# Patient Record
Sex: Female | Born: 1950
Health system: Southern US, Community
[De-identification: ages and names within clinical notes are randomized; demographics above are authoritative.]

## PROBLEM LIST (undated history)

## (undated) DIAGNOSIS — I10 Essential (primary) hypertension: Secondary | ICD-10-CM

## (undated) DIAGNOSIS — K439 Ventral hernia without obstruction or gangrene: Secondary | ICD-10-CM

## (undated) DIAGNOSIS — E785 Hyperlipidemia, unspecified: Secondary | ICD-10-CM

## (undated) DIAGNOSIS — K760 Fatty (change of) liver, not elsewhere classified: Secondary | ICD-10-CM

## (undated) DIAGNOSIS — M810 Age-related osteoporosis without current pathological fracture: Secondary | ICD-10-CM

## (undated) DIAGNOSIS — E119 Type 2 diabetes mellitus without complications: Secondary | ICD-10-CM

## (undated) HISTORY — DX: Essential (primary) hypertension: I10

## (undated) HISTORY — DX: Type 2 diabetes mellitus without complications: E11.9

## (undated) HISTORY — DX: Fatty (change of) liver, not elsewhere classified: K76.0

## (undated) HISTORY — PX: COLONOSCOPY: SHX174

## (undated) HISTORY — DX: Age-related osteoporosis without current pathological fracture: M81.0

## (undated) HISTORY — DX: Ventral hernia without obstruction or gangrene: K43.9

## (undated) HISTORY — DX: Hyperlipidemia, unspecified: E78.5

---

## 2010-04-27 ENCOUNTER — Ambulatory Visit: Payer: Self-pay | Admitting: Diagnostic Radiology

## 2010-04-27 ENCOUNTER — Emergency Department (HOSPITAL_BASED_OUTPATIENT_CLINIC_OR_DEPARTMENT_OTHER): Admission: EM | Admit: 2010-04-27 | Discharge: 2010-04-27 | Payer: Self-pay | Admitting: Emergency Medicine

## 2014-01-09 DIAGNOSIS — J069 Acute upper respiratory infection, unspecified: Secondary | ICD-10-CM | POA: Diagnosis not present

## 2014-01-29 ENCOUNTER — Ambulatory Visit (INDEPENDENT_AMBULATORY_CARE_PROVIDER_SITE_OTHER): Payer: Medicare Other | Admitting: Family

## 2014-01-29 ENCOUNTER — Encounter: Payer: Self-pay | Admitting: Family

## 2014-01-29 VITALS — BP 114/78 | HR 67 | Temp 97.8°F | Resp 16 | Ht 59.5 in | Wt 117.0 lb

## 2014-01-29 DIAGNOSIS — H9190 Unspecified hearing loss, unspecified ear: Secondary | ICD-10-CM

## 2014-01-29 DIAGNOSIS — F3289 Other specified depressive episodes: Secondary | ICD-10-CM | POA: Diagnosis not present

## 2014-01-29 DIAGNOSIS — H9191 Unspecified hearing loss, right ear: Secondary | ICD-10-CM

## 2014-01-29 DIAGNOSIS — K219 Gastro-esophageal reflux disease without esophagitis: Secondary | ICD-10-CM

## 2014-01-29 DIAGNOSIS — M79609 Pain in unspecified limb: Secondary | ICD-10-CM

## 2014-01-29 DIAGNOSIS — M79644 Pain in right finger(s): Secondary | ICD-10-CM

## 2014-01-29 DIAGNOSIS — F329 Major depressive disorder, single episode, unspecified: Secondary | ICD-10-CM

## 2014-01-29 MED ORDER — OMEPRAZOLE 40 MG PO CPDR: 40.0000 mg | DELAYED_RELEASE_CAPSULE | Freq: Every day | ORAL | Status: DC

## 2014-01-29 MED ORDER — ESCITALOPRAM OXALATE 10 MG PO TABS: ORAL_TABLET | ORAL | Status: DC

## 2014-01-29 NOTE — Patient Instructions (Addendum)
Increase omeprazole to 40mg  once daily.  A prescription has been sent to your pharmacy. Start lexapro 10mg .  1/2 tab by mouth once daily for 1 week, then increase to a full tab once daily on week two. Call if cough worsens or if it does not improve in next 2 weeks. Follow up in 1 month for a medicare wellness visit.   Welcome to [facility]!

## 2014-01-29 NOTE — Progress Notes (Signed)
Pre visit review using our clinic review tool, if applicable. No additional management support is needed unless otherwise documented below in the visit note. 

## 2014-01-29 NOTE — Progress Notes (Signed)
Subjective:    Patient ID: Hailey Rasmussen, female    DOB: Aug 27, 1950, 63 y.o.   MRN: [id number]  HPI  Son reports that she has followed by [facility]. She presents today with her son who assists with vietnamese translation.      Pt has had a cough for 4 weeks. Was really bad initially. She was evaluated at urgent care ([facility]). She was given antibiotic and cough medication and symptoms improved.  Now a dry cough at night.   She denies nasal congestion or fever.    Establish Care Pt new to establish care. Wants a check up. Pt has never had colonoscopy, Unsure if she has had a DEXA, last mammogram about 2 yrs ago. Last tetanus 07/2013.  Right thumb- she has some tingling/soreness. Denies injury.  She is retired.  This has been present x 2-3 months.      Cough Pt reports cough x 4 weeks. Completed antibiotic through urgent care but cough persists. tingling Pt reports tingling and sensations like electrical shocks in her right tunmb x 2-3 months.  GERD- reports well controlled with omeprazole.   Depression-  Had hospitalization back in [place] when she was separated from her family.  Son notes that she is "not a happy person."  Not tearful.  Son notes + irritability, + anxiety.    Hearing loss- told perforated TM. Has some associated hearing loss.   Review of Systems  Constitutional: Negative for unexpected weight change.  HENT: Negative for postnasal drip.   Respiratory: Positive for cough.   Cardiovascular: Negative for chest pain.  Gastrointestinal: Negative for abdominal pain.  Genitourinary: Negative for dysuria and frequency.  Musculoskeletal:       Takes prn meloxicam for bilateral knee pain  Neurological: Negative for headaches.  Hematological: Negative for adenopathy.   Past Medical History  Diagnosis Date  . Hyperlipidemia     History   Social History  . Marital Status: Married    Spouse Name: N/A    Number of Children: N/A  . Years of  Education: N/A   Occupational History  . Not on file.   Social History Main Topics  . Smoking status: Never Smoker   . Smokeless tobacco: Never Used  . Alcohol Use: No  . Drug Use: Not on file  . Sexual Activity: Not on file   Other Topics Concern  . Not on file   Social History Narrative   Lives with her husband and son   She has 7 children (one daughter died at age 92) 1 children all live in [place]   Retired homemaker   She completed high school   Moved to [place] from [place] in 1997          History reviewed. No pertinent past surgical history.  Family History  Problem Relation Age of Onset  . Heart disease Father     No Known Allergies  No current outpatient prescriptions on file prior to visit.   No current facility-administered medications on file prior to visit.    BP 114/78  Pulse 67  Temp(Src) 97.8 F (36.6 C) (Oral)  Resp 16  Ht 4' 11.5" (1.511 m)  Wt 117 lb (53.071 kg)  BMI 23.24 kg/m2  SpO2 97%  LMP 07/27/1995       Objective:   Physical Exam  Constitutional: She is oriented to person, place, and time. She appears well-developed and well-nourished. No distress.  HENT:  Head: Normocephalic and atraumatic.  Right Ear: Tympanic membrane and ear canal normal.  Left Ear: Tympanic membrane and ear canal normal.  Mouth/Throat: No oropharyngeal exudate, posterior oropharyngeal edema or posterior oropharyngeal erythema.  Cardiovascular: Normal rate and regular rhythm.   No murmur heard. Pulmonary/Chest: Effort normal and breath sounds normal. No respiratory distress. She has no wheezes. She has no rales. She exhibits no tenderness.  Neurological: She is alert and oriented to person, place, and time.  Psychiatric: She has a normal mood and affect. Her behavior is normal. Judgment and thought content normal.          Assessment & Plan:

## 2014-01-31 DIAGNOSIS — H9190 Unspecified hearing loss, unspecified ear: Secondary | ICD-10-CM | POA: Insufficient documentation

## 2014-01-31 DIAGNOSIS — M79644 Pain in right finger(s): Secondary | ICD-10-CM | POA: Insufficient documentation

## 2014-01-31 DIAGNOSIS — K219 Gastro-esophageal reflux disease without esophagitis: Secondary | ICD-10-CM | POA: Insufficient documentation

## 2014-01-31 DIAGNOSIS — F329 Major depressive disorder, single episode, unspecified: Secondary | ICD-10-CM | POA: Insufficient documentation

## 2014-01-31 DIAGNOSIS — F32A Depression, unspecified: Secondary | ICD-10-CM | POA: Insufficient documentation

## 2014-01-31 NOTE — Assessment & Plan Note (Signed)
Trial of meloxicam.  

## 2014-01-31 NOTE — Assessment & Plan Note (Signed)
Will initiate lexapro 10 mg.  I instructed pt to start 1/2 tablet once daily for 1 week and then increase to a full tablet once daily on week two as tolerated.  We discussed common side effects such as nausea, drowsiness and weight gain.  Also discussed rare but serious side effect of suicide ideation.  She is instructed to discontinue medication go directly to ED if this occurs.  Pt verbalizes understanding.  Plan follow up in 1 month to evaluate progress.

## 2014-01-31 NOTE — Assessment & Plan Note (Signed)
Could be contributing to cough. Will increase prilosec to 40mg .

## 2014-01-31 NOTE — Assessment & Plan Note (Signed)
Declines referral to ENT.  No TM perforation is noted.

## 2014-03-04 ENCOUNTER — Inpatient Hospital Stay (HOSPITAL_BASED_OUTPATIENT_CLINIC_OR_DEPARTMENT_OTHER)
Admission: RE | Admit: 2014-03-04 | Discharge: 2014-03-04 | Disposition: A | Discharge status: Home / Self Care | Payer: Medicare Other | Source: Ambulatory Visit | Attending: Family | Admitting: Family

## 2014-03-04 ENCOUNTER — Ambulatory Visit (HOSPITAL_BASED_OUTPATIENT_CLINIC_OR_DEPARTMENT_OTHER)
Admission: RE | Admit: 2014-03-04 | Discharge: 2014-03-04 | Disposition: A | Discharge status: Home / Self Care | Payer: Medicare Other | Source: Ambulatory Visit | Attending: Family | Admitting: Family

## 2014-03-04 ENCOUNTER — Other Ambulatory Visit: Payer: Self-pay | Admitting: Family

## 2014-03-04 ENCOUNTER — Ambulatory Visit (INDEPENDENT_AMBULATORY_CARE_PROVIDER_SITE_OTHER): Payer: Medicare Other | Admitting: Family

## 2014-03-04 ENCOUNTER — Encounter: Payer: Self-pay | Admitting: Family

## 2014-03-04 VITALS — BP 140/80 | HR 65 | Temp 98.1°F | Resp 16 | Ht 59.5 in | Wt 116.0 lb

## 2014-03-04 DIAGNOSIS — F329 Major depressive disorder, single episode, unspecified: Secondary | ICD-10-CM

## 2014-03-04 DIAGNOSIS — K219 Gastro-esophageal reflux disease without esophagitis: Secondary | ICD-10-CM

## 2014-03-04 DIAGNOSIS — M79609 Pain in unspecified limb: Secondary | ICD-10-CM

## 2014-03-04 DIAGNOSIS — Z0000 Encounter for general adult medical examination without abnormal findings: Secondary | ICD-10-CM

## 2014-03-04 DIAGNOSIS — F3289 Other specified depressive episodes: Secondary | ICD-10-CM

## 2014-03-04 DIAGNOSIS — Z1231 Encounter for screening mammogram for malignant neoplasm of breast: Secondary | ICD-10-CM | POA: Diagnosis not present

## 2014-03-04 DIAGNOSIS — Z1211 Encounter for screening for malignant neoplasm of colon: Secondary | ICD-10-CM

## 2014-03-04 DIAGNOSIS — E785 Hyperlipidemia, unspecified: Secondary | ICD-10-CM

## 2014-03-04 DIAGNOSIS — Z1239 Encounter for other screening for malignant neoplasm of breast: Secondary | ICD-10-CM

## 2014-03-04 DIAGNOSIS — Z23 Encounter for immunization: Secondary | ICD-10-CM

## 2014-03-04 DIAGNOSIS — Z13820 Encounter for screening for osteoporosis: Secondary | ICD-10-CM

## 2014-03-04 DIAGNOSIS — M79644 Pain in right finger(s): Secondary | ICD-10-CM

## 2014-03-04 LAB — LIPID PANEL
Cholesterol: 216 mg/dL — ABNORMAL HIGH (ref 0–200)
HDL: 54 mg/dL (ref >39)
LDL Cholesterol: 128 mg/dL — ABNORMAL HIGH (ref 0–99)
Total CHOL/HDL Ratio: 4 Ratio
Triglycerides: 169 mg/dL — ABNORMAL HIGH (ref <150)
VLDL: 34 mg/dL (ref 0–40)

## 2014-03-04 LAB — HEPATIC FUNCTION PANEL
ALT: 24 U/L (ref 0–35)
AST: 25 U/L (ref 0–37)
Albumin: 4.8 g/dL (ref 3.5–5.2)
Alkaline Phosphatase: 62 U/L (ref 39–117)
Bilirubin, Direct: 0.1 mg/dL (ref 0.0–0.3)
Indirect Bilirubin: 0.7 mg/dL (ref 0.2–1.2)
Total Bilirubin: 0.8 mg/dL (ref 0.2–1.2)
Total Protein: 7.7 g/dL (ref 6.0–8.3)

## 2014-03-04 LAB — BASIC METABOLIC PANEL
BUN: 17 mg/dL (ref 6–23)
CO2: 26 mEq/L (ref 19–32)
Calcium: 10.3 mg/dL (ref 8.4–10.5)
Chloride: 104 mEq/L (ref 96–112)
Creat: 0.59 mg/dL (ref 0.50–1.10)
Glucose, Bld: 102 mg/dL — ABNORMAL HIGH (ref 70–99)
Potassium: 4.6 mEq/L (ref 3.5–5.3)
Sodium: 139 mEq/L (ref 135–145)

## 2014-03-04 MED ORDER — ESCITALOPRAM OXALATE 10 MG PO TABS: ORAL_TABLET | ORAL | Status: DC

## 2014-03-04 MED ORDER — MELOXICAM 7.5 MG PO TABS: 7.5000 mg | ORAL_TABLET | Freq: Every day | ORAL | Status: DC | PRN

## 2014-03-04 NOTE — Patient Instructions (Addendum)
Please complete lab work prior to leaving.  Please check with your insurance re: coverage for shingles vaccine if covered we will plan to give you next visit.   Please schedule your mammogram on the first floor in the imaging department. You will be contacted re: scheduling of your colonoscopy and your bone density. Start lexapro. Follow up in 1 month.

## 2014-03-04 NOTE — Progress Notes (Addendum)
Subjective:    Patient ID: Hailey Rasmussen, female    DOB: August 10, 1950, 63 y.o.   MRN: [id number]  HPI  Subjective:   Patient here for Welcome to Medicare visit and management of other chronic and acute problems.  Pt presents today with vietnamese interpretor and her son.  Depression- last visit she was started on lexapro.   She reports that she has not started lexapro.  Denies sadness or irritability.  She reports that she generally sleeps ok.    Cough- last visit she noted cough. Reports that her cough has improved, however she has not increased her prilosec as recommended..    Reports right tinnitis.  This is not new  R thumb pain- Improved. last visit meloxicam was started prn. Reports that she has occasional numbness but not pain.   Immunizations: due for pneumovax and zostavax.  Diet:  Reports healthy diet Exercise:  little Colonoscopy: never,  Dexa: due Pap Smear: declines Mammogram: due    Risk factors: none  Roster of Physicians Providing Medical Care to Patient: Activities of Daily Living  In your present state of health, do you have any difficulty performing the following activities? Preparing food and eating?: No  Bathing yourself: No  Getting dressed: No  Using the toilet:No  Moving around from place to place: No  In the past year have you fallen or had a near fall?:No    Home Safety: Has smoke detector and wears seat belts. No firearms. No excess sun exposure.  Diet and Exercise  Current exercise habits:  Dietary issues discussed: healthy diet   Depression Screen  (Note: if answer to either of the following is "Yes", then a more complete depression screening is indicated)  Q1: Over the past two weeks, have you felt down, depressed or hopeless?no  Q2: Over the past two weeks, have you felt little interest or pleasure in doing things? no   The following portions of the patient's history were reviewed and updated as appropriate: allergies, current  medications, past family history, past medical history, past social history, past surgical history and problem list.    Review of Systems  See hpi   Objective:   Vision: see nursing Hearing: able to hear forced whisper at 6 feet Body mass index: Body mass index is 23.05 kg/(m^2). Cognitive Impairment Assessment: cognition, memory and judgment appear normal.   Physical Exam  Constitutional: She is oriented to person, place, and time. She appears well-developed and well-nourished. No distress.  HENT:  Head: Normocephalic and atraumatic.  Right Ear: Tympanic membrane and ear canal normal.  Left Ear: Tympanic membrane and ear canal normal.  Mouth/Throat: Oropharynx is clear and moist.  Eyes: Pupils are equal, round, and reactive to light. No scleral icterus.  Neck: Normal range of motion. No thyromegaly present.  Cardiovascular: Normal rate and regular rhythm.   No murmur heard. Pulmonary/Chest: Effort normal and breath sounds normal. No respiratory distress. He has no wheezes. She has no rales. She exhibits no tenderness.  Abdominal: Soft. Bowel sounds are normal. He exhibits no distension and no mass. There is no tenderness. There is no rebound and no guarding.  Musculoskeletal: She exhibits no edema.  Lymphadenopathy:    She has no cervical adenopathy.  Neurological: She is alert and oriented to person, place, and time. She has normal reflexes. She exhibits normal muscle tone. Coordination normal.  Skin: Skin is warm and dry.  Psychiatric: She has a normal mood and affect. Her behavior is normal. Judgment and  thought content normal.  Breasts: Examined lying Right: Without masses, retractions, discharge or axillary adenopathy.  Left: Without masses, retractions, discharge or axillary adenopathy.  Pelvic: declines           Assessment & Plan:     Assessment:   Medicare wellness utd on preventive parameters    Plan:    During the course of the visit the patient was  educated and counseled about appropriate screening and preventive services including:      Screening mammography  Bone densitometry screening  Diabetes screening  Vaccines / LABS Pnemonccoal Vaccine  Today- will need zostavax next visit.   Patient Instructions (the written plan) was given to the patient.      Review of Systems     Objective:   Physical Exam        Assessment & Plan:

## 2014-03-04 NOTE — Progress Notes (Signed)
Pre visit review using our clinic review tool, if applicable. No additional management support is needed unless otherwise documented below in the visit note. 

## 2014-03-05 ENCOUNTER — Encounter: Payer: Self-pay | Admitting: Family

## 2014-03-05 DIAGNOSIS — E785 Hyperlipidemia, unspecified: Secondary | ICD-10-CM | POA: Insufficient documentation

## 2014-03-12 ENCOUNTER — Other Ambulatory Visit: Payer: Medicare Other

## 2014-04-05 ENCOUNTER — Ambulatory Visit (INDEPENDENT_AMBULATORY_CARE_PROVIDER_SITE_OTHER): Payer: Medicare Other | Admitting: Family

## 2014-04-05 ENCOUNTER — Encounter: Payer: Self-pay | Admitting: Family

## 2014-04-05 VITALS — BP 120/78 | HR 58 | Temp 98.3°F | Resp 16 | Ht 59.5 in | Wt 115.6 lb

## 2014-04-05 DIAGNOSIS — M545 Low back pain, unspecified: Secondary | ICD-10-CM | POA: Diagnosis not present

## 2014-04-05 DIAGNOSIS — F3289 Other specified depressive episodes: Secondary | ICD-10-CM

## 2014-04-05 DIAGNOSIS — F329 Major depressive disorder, single episode, unspecified: Secondary | ICD-10-CM

## 2014-04-05 DIAGNOSIS — Z23 Encounter for immunization: Secondary | ICD-10-CM

## 2014-04-05 DIAGNOSIS — Z2911 Encounter for prophylactic immunotherapy for respiratory syncytial virus (RSV): Secondary | ICD-10-CM

## 2014-04-05 DIAGNOSIS — R7309 Other abnormal glucose: Secondary | ICD-10-CM

## 2014-04-05 DIAGNOSIS — R739 Hyperglycemia, unspecified: Secondary | ICD-10-CM

## 2014-04-05 LAB — HEMOGLOBIN A1C: Hgb A1c MFr Bld: 6.1 % (ref 4.6–6.5)

## 2014-04-05 MED ORDER — ESCITALOPRAM OXALATE 10 MG PO TABS: ORAL_TABLET | ORAL | Status: DC

## 2014-04-05 MED ORDER — MELOXICAM 7.5 MG PO TABS: 7.5000 mg | ORAL_TABLET | Freq: Every day | ORAL | Status: DC | PRN

## 2014-04-05 NOTE — Patient Instructions (Addendum)
Please complete lab work prior to leaving.  Please contact GI to schedule appointment: [phone] You should be contacted about your bone density appointment- let me know if you have not heard back in 1 week. Follow up in 3 months.

## 2014-04-05 NOTE — Assessment & Plan Note (Signed)
Mild improvement. Continue lexapro. Follow up in 3 months.

## 2014-04-05 NOTE — Assessment & Plan Note (Signed)
Advised pt that she can use meloxicam as needed for flare ups and to let me know if symptoms worsen or do not improve.

## 2014-04-05 NOTE — Progress Notes (Signed)
Pre visit review using our clinic review tool, if applicable. No additional management support is needed unless otherwise documented below in the visit note. 

## 2014-04-05 NOTE — Progress Notes (Signed)
   Subjective:    Patient ID: Hailey Rasmussen, female    DOB: Dec 31, 1950, 63 y.o.   MRN: [id number]  HPI  Hailey Rasmussen is a 63 yr old female who presents today for follow up:  1) Depression- last visit she was started on lexapro.  Her son assists today with vietnamese translation. The pt has been on lexapro x 1 month. She is reportedly tolerating without difficulty. Son notes slight improvement in her ability to enjoy activities.  Wants to give eht medicine "more time."   2) Mild low back pain- pt reports occasional mild low back pain.   Review of Systems    see HPI  Past Medical History  Diagnosis Date  . Hyperlipidemia     History   Social History  . Marital Status: Married    Spouse Name: N/A    Number of Children: N/A  . Years of Education: N/A   Occupational History  . Not on file.   Social History Main Topics  . Smoking status: Never Smoker   . Smokeless tobacco: Never Used  . Alcohol Use: No  . Drug Use: Not on file  . Sexual Activity: Not on file   Other Topics Concern  . Not on file   Social History Narrative   Lives with her husband and son   She has 7 children (one daughter died at age 81) 62 children all live in [place]   Retired homemaker   She completed high school   Moved to [place] from [place] in 1997          No past surgical history on file.  Family History  Problem Relation Age of Onset  . Heart disease Father     No Known Allergies  Current Outpatient Prescriptions on File Prior to Visit  Medication Sig Dispense Refill  . omeprazole (PRILOSEC) 40 MG capsule Take 1 capsule (40 mg total) by mouth daily.  30 capsule  3   No current facility-administered medications on file prior to visit.    BP 120/78  Pulse 58  Temp(Src) 98.3 F (36.8 C) (Oral)  Resp 16  Ht 4' 11.5" (1.511 m)  Wt 115 lb 9.6 oz (52.436 kg)  BMI 22.97 kg/m2  SpO2 98%  LMP 07/27/1995    Objective:   Physical Exam  Constitutional: She is oriented to person, place, and  time. She appears well-developed and well-nourished. No distress.  HENT:  Head: Normocephalic and atraumatic.  Cardiovascular: Normal rate and regular rhythm.   No murmur heard. Pulmonary/Chest: Effort normal and breath sounds normal. No respiratory distress. She has no wheezes. She has no rales. She exhibits no tenderness.  Musculoskeletal:       Thoracic back: She exhibits no bony tenderness.       Lumbar back: She exhibits no bony tenderness.  Neurological: She is alert and oriented to person, place, and time.  Psychiatric: She has a normal mood and affect. Her behavior is normal. Judgment and thought content normal.          Assessment & Plan:  The wrong phone # was listed in pt's medical record so son did not receive referral message.  I have asked him to contact GI and our scheduler to call him with bone density apt.

## 2014-04-07 ENCOUNTER — Encounter: Payer: Self-pay | Admitting: Family

## 2014-04-07 DIAGNOSIS — R739 Hyperglycemia, unspecified: Secondary | ICD-10-CM | POA: Insufficient documentation

## 2014-04-07 NOTE — Assessment & Plan Note (Signed)
Cough has improved, continue PPI.

## 2014-04-07 NOTE — Assessment & Plan Note (Signed)
Improved.  Continue sparing use of prn meloxicam.

## 2014-04-07 NOTE — Assessment & Plan Note (Signed)
Pt to start lexapro.

## 2014-05-07 ENCOUNTER — Encounter: Payer: Self-pay | Admitting: Family

## 2014-06-03 ENCOUNTER — Encounter: Payer: Self-pay | Admitting: Family

## 2014-06-03 ENCOUNTER — Ambulatory Visit (INDEPENDENT_AMBULATORY_CARE_PROVIDER_SITE_OTHER): Payer: Medicare Other | Admitting: Family

## 2014-06-03 VITALS — BP 128/80 | HR 58 | Temp 98.0°F | Resp 18 | Ht 59.5 in | Wt 113.2 lb

## 2014-06-03 DIAGNOSIS — H6692 Otitis media, unspecified, left ear: Secondary | ICD-10-CM | POA: Diagnosis not present

## 2014-06-03 DIAGNOSIS — H7292 Unspecified perforation of tympanic membrane, left ear: Secondary | ICD-10-CM | POA: Diagnosis not present

## 2014-06-03 DIAGNOSIS — K219 Gastro-esophageal reflux disease without esophagitis: Secondary | ICD-10-CM

## 2014-06-03 MED ORDER — OMEPRAZOLE 40 MG PO CPDR: 40.0000 mg | DELAYED_RELEASE_CAPSULE | Freq: Every day | ORAL | Status: DC

## 2014-06-03 MED ORDER — AMOXICILLIN 500 MG PO CAPS: 500.0000 mg | ORAL_CAPSULE | Freq: Two times a day (BID) | ORAL | Status: DC

## 2014-06-03 MED ORDER — MELOXICAM 7.5 MG PO TABS: 7.5000 mg | ORAL_TABLET | Freq: Every day | ORAL | Status: DC | PRN

## 2014-06-03 MED ORDER — ESCITALOPRAM OXALATE 10 MG PO TABS: ORAL_TABLET | ORAL | Status: DC

## 2014-06-03 NOTE — Progress Notes (Signed)
   Subjective:    Patient ID: Hailey Rasmussen, female    DOB: Nov 30, 1950, 63 y.o.   MRN: [id number]  HPI  Hailey Rasmussen is a 63 yr old female who presents today with vietnamese interpreter with chief complaint of difficulty hearing out of the left ear.  Notes associated fluid drainage from the left ear for 2-3 weeks.  She denies pain but notes that the ear is "itching. Reports that 2 years ago she had a procedure performed on the ear "near [facility].     She is not taking omeprazole. Reports + coughing around  1-2 AM.    Review of Systems    see HPI  Past Medical History  Diagnosis Date  . Hyperlipidemia     History   Social History  . Marital Status: Married    Spouse Name: N/A    Number of Children: N/A  . Years of Education: N/A   Occupational History  . Not on file.   Social History Main Topics  . Smoking status: Never Smoker   . Smokeless tobacco: Never Used  . Alcohol Use: No  . Drug Use: Not on file  . Sexual Activity: Not on file   Other Topics Concern  . Not on file   Social History Narrative   Lives with her husband and son   She has 7 children (one daughter died at age 38) 84 children all live in [place]   Retired homemaker   She completed high school   Moved to [place] from [place] in 1997          No past surgical history on file.  Family History  Problem Relation Age of Onset  . Heart disease Father     No Known Allergies  Current Outpatient Prescriptions on File Prior to Visit  Medication Sig Dispense Refill  . escitalopram (LEXAPRO) 10 MG tablet One tablet by mouth once daily 30 tablet 3  . meloxicam (MOBIC) 7.5 MG tablet Take 1 tablet (7.5 mg total) by mouth daily as needed for pain. 30 tablet 3  . omeprazole (PRILOSEC) 40 MG capsule Take 1 capsule (40 mg total) by mouth daily. 30 capsule 3   No current facility-administered medications on file prior to visit.    BP 128/80 mmHg  Pulse 58  Temp(Src) 98 F (36.7 C) (Oral)  Resp 18  Ht  4' 11.5" (1.511 m)  Wt 113 lb 3.2 oz (51.347 kg)  BMI 22.49 kg/m2  SpO2 99%  LMP 07/27/1995    Objective:   Physical Exam  Constitutional: She is oriented to person, place, and time. She appears well-developed and well-nourished. No distress.  HENT:  Head: Normocephalic and atraumatic.  Right Ear: Tympanic membrane and ear canal normal.  L TM is dull, purulent drainage noted behind TM.  Small performation in TM.    Cardiovascular: Normal rate and regular rhythm.   No murmur heard. Pulmonary/Chest: Effort normal and breath sounds normal. No respiratory distress. She has no wheezes. She has no rales. She exhibits no tenderness.  Neurological: She is alert and oriented to person, place, and time.  Psychiatric: She has a normal mood and affect. Her behavior is normal. Judgment and thought content normal.          Assessment & Plan:

## 2014-06-03 NOTE — Patient Instructions (Addendum)
Start amoxicillin for your ear infection. You will be contacted about your referral to the Ear nose and throat doctor in [place]. Please contact [facility] to schedule your colonoscopy appointment: [address], [place], [place] [address] [phone] Start prilosec (omeprazole) for cough.   Follow up in December as scheduled, sooner if problems/concerns.

## 2014-06-04 DIAGNOSIS — H7290 Unspecified perforation of tympanic membrane, unspecified ear: Secondary | ICD-10-CM | POA: Insufficient documentation

## 2014-06-04 NOTE — Assessment & Plan Note (Signed)
Deteriorated (cough at HS) advised pt to resume PPI.

## 2014-06-04 NOTE — Assessment & Plan Note (Signed)
Will rx with amoxicillin and refer to ENT- pt requests referral to new ENT.  Will arrange.

## 2014-06-17 ENCOUNTER — Encounter: Payer: Self-pay | Admitting: Family

## 2014-06-17 ENCOUNTER — Ambulatory Visit (INDEPENDENT_AMBULATORY_CARE_PROVIDER_SITE_OTHER): Payer: Medicare Other | Admitting: Family

## 2014-06-17 ENCOUNTER — Telehealth: Payer: Self-pay | Admitting: Family

## 2014-06-17 VITALS — BP 137/56 | HR 59 | Temp 97.9°F | Resp 18 | Ht 59.5 in | Wt 115.6 lb

## 2014-06-17 DIAGNOSIS — F329 Major depressive disorder, single episode, unspecified: Secondary | ICD-10-CM

## 2014-06-17 DIAGNOSIS — R739 Hyperglycemia, unspecified: Secondary | ICD-10-CM

## 2014-06-17 DIAGNOSIS — F32A Depression, unspecified: Secondary | ICD-10-CM

## 2014-06-17 DIAGNOSIS — H7292 Unspecified perforation of tympanic membrane, left ear: Secondary | ICD-10-CM

## 2014-06-17 DIAGNOSIS — H7290 Unspecified perforation of tympanic membrane, unspecified ear: Secondary | ICD-10-CM | POA: Diagnosis not present

## 2014-06-17 NOTE — Patient Instructions (Addendum)
You will be contacted about your referral to ENT. We will call you if we are able to find a therapist who speaks vietnamese locally.  Follow up in January before your trip.

## 2014-06-17 NOTE — Progress Notes (Signed)
Pre visit review using our clinic review tool, if applicable. No additional management support is needed unless otherwise documented below in the visit note. 

## 2014-06-17 NOTE — Progress Notes (Signed)
Subjective:    Patient ID: Hailey Rasmussen, female    DOB: July 21, 1951, 63 y.o.   MRN: [id number]  HPI  Hailey Rasmussen is a 63 yr old female who presents today for follow up. She is accompanied by her son who assists with translation.   1) Depression-Son notes that her symptoms are about the same. Still somewhat withdrawn. Does enjoy working outside in the nice weather. Otherwise she does not leave the house much and does not participate in family outings.   2) Hyperglycemia- she has switched to brown rice.  Occasional noodles.   Lab Results  Component Value Date   HGBA1C 6.1 04/05/2014   3) L Otitis media- last visit she was treated with amoxicillin and a referral was made to ENT, she was unable to get in with ENT at [facility] and is requesting a referral to another ENT.    Review of Systems See HPI  Past Medical History  Diagnosis Date  . Hyperlipidemia     History   Social History  . Marital Status: Married    Spouse Name: N/A    Number of Children: N/A  . Years of Education: N/A   Occupational History  . Not on file.   Social History Main Topics  . Smoking status: Never Smoker   . Smokeless tobacco: Never Used  . Alcohol Use: No  . Drug Use: Not on file  . Sexual Activity: Not on file   Other Topics Concern  . Not on file   Social History Narrative   Lives with her husband and son   She has 7 children (one daughter died at age 20) 52 children all live in [place]   Retired homemaker   She completed high school   Moved to [place] from [place] in 1997          No past surgical history on file.  Family History  Problem Relation Age of Onset  . Heart disease Father     No Known Allergies  Current Outpatient Prescriptions on File Prior to Visit  Medication Sig Dispense Refill  . amoxicillin (AMOXIL) 500 MG capsule Take 1 capsule (500 mg total) by mouth 2 (two) times daily. 20 capsule 0  . escitalopram (LEXAPRO) 10 MG tablet One tablet by mouth once daily 90  tablet 0  . meloxicam (MOBIC) 7.5 MG tablet Take 1 tablet (7.5 mg total) by mouth daily as needed for pain. 90 tablet 0  . omeprazole (PRILOSEC) 40 MG capsule Take 1 capsule (40 mg total) by mouth daily. 90 capsule 0   No current facility-administered medications on file prior to visit.    BP 137/56 mmHg  Pulse 59  Temp(Src) 97.9 F (36.6 C) (Oral)  Resp 18  Ht 4' 11.5" (1.511 m)  Wt 115 lb 9.6 oz (52.436 kg)  BMI 22.97 kg/m2  SpO2 99%  LMP 07/27/1995       Objective:   Physical Exam  Constitutional: She is oriented to person, place, and time. She appears well-developed and well-nourished. No distress.  HENT:  Head: Normocephalic and atraumatic.  Right Ear: Tympanic membrane and ear canal normal.  Left Ear: Tympanic membrane is perforated. Tympanic membrane is not injected.  Cardiovascular: Normal rate and regular rhythm.   No murmur heard. Pulmonary/Chest: Effort normal and breath sounds normal. No respiratory distress. She has no wheezes. She has no rales. She exhibits no tenderness.  Neurological: She is alert and oriented to person, place, and time.  Psychiatric: She has  a normal mood and affect. Her behavior is normal. Judgment and thought content normal.          Assessment & Plan:

## 2014-06-17 NOTE — Telephone Encounter (Signed)
Please contact pt's son and let him know that I have searched and I cannot find a local therapist who speaks vietnamese unfortunately.

## 2014-06-18 NOTE — Telephone Encounter (Signed)
Left message for pts son to return my call.

## 2014-06-23 NOTE — Assessment & Plan Note (Signed)
Refer to another ENT at family request.  No obvious infection at this time.

## 2014-06-23 NOTE — Assessment & Plan Note (Addendum)
I think that she might benefit from meeting with a therapist, but unfortunately she does not speak [place].  I have not been able to locate a therapist who speaks vietnamese in the area and the sone does not want to have her work with an interpreter when working with therapist because he does not think that his mother would be comfortable with this.  I agree that this would be awkward for her. Plan to continue lexapro.

## 2014-06-23 NOTE — Assessment & Plan Note (Signed)
Lab Results  Component Value Date   HGBA1C 6.1 04/05/2014   Stable, continue dietary modifications.

## 2014-06-25 NOTE — Telephone Encounter (Signed)
Spoke to pt's daughter, Dulce Sellar. She states pt's son is on vacation this week and will be out of the country later this month.  I asked her to have pt's son return my call and will need to update HIPPA contacts at next visit.

## 2014-06-26 ENCOUNTER — Telehealth: Payer: Self-pay | Admitting: Family

## 2014-06-26 NOTE — Telephone Encounter (Signed)
Caller name: Jahnay Lantier Relation to KU:VJDY Call back number:[phone] Pharmacy:  Reason for call: pt is returning your call, please call back

## 2014-06-26 NOTE — Telephone Encounter (Signed)
Spoke with pt's son at below #. He prefers [place] to wait on referral at this time. States pt will be going back home for 1 month after the 1st of the year. He request to add his sister to pt's HIPPA contacts. Advised him we need to have pt update HIPPA contact form at upcoming appt in January to include daughter. He voices understanding.

## 2014-07-03 DIAGNOSIS — H7292 Unspecified perforation of tympanic membrane, left ear: Secondary | ICD-10-CM | POA: Diagnosis not present

## 2014-07-15 ENCOUNTER — Ambulatory Visit: Payer: Medicare Other | Admitting: Family

## 2014-07-30 ENCOUNTER — Encounter: Payer: Self-pay | Admitting: Family

## 2014-07-30 ENCOUNTER — Ambulatory Visit (HOSPITAL_BASED_OUTPATIENT_CLINIC_OR_DEPARTMENT_OTHER)
Admission: RE | Admit: 2014-07-30 | Discharge: 2014-07-30 | Disposition: A | Discharge status: Home / Self Care | Payer: Medicare Other | Source: Ambulatory Visit | Attending: Family | Admitting: Family

## 2014-07-30 ENCOUNTER — Ambulatory Visit (INDEPENDENT_AMBULATORY_CARE_PROVIDER_SITE_OTHER): Payer: Medicare Other | Admitting: Family

## 2014-07-30 VITALS — BP 108/70 | HR 62 | Temp 98.2°F | Resp 14 | Ht 59.5 in | Wt 112.4 lb

## 2014-07-30 DIAGNOSIS — J8410 Pulmonary fibrosis, unspecified: Secondary | ICD-10-CM | POA: Diagnosis not present

## 2014-07-30 DIAGNOSIS — F329 Major depressive disorder, single episode, unspecified: Secondary | ICD-10-CM

## 2014-07-30 DIAGNOSIS — R059 Cough, unspecified: Secondary | ICD-10-CM | POA: Insufficient documentation

## 2014-07-30 DIAGNOSIS — R05 Cough: Secondary | ICD-10-CM | POA: Insufficient documentation

## 2014-07-30 DIAGNOSIS — H7292 Unspecified perforation of tympanic membrane, left ear: Secondary | ICD-10-CM | POA: Diagnosis not present

## 2014-07-30 DIAGNOSIS — F32A Depression, unspecified: Secondary | ICD-10-CM

## 2014-07-30 MED ORDER — OMEPRAZOLE 40 MG PO CPDR: 40.0000 mg | DELAYED_RELEASE_CAPSULE | Freq: Every day | ORAL | Status: DC

## 2014-07-30 MED ORDER — ESCITALOPRAM OXALATE 10 MG PO TABS: ORAL_TABLET | ORAL | Status: DC

## 2014-07-30 MED ORDER — HYDROCODONE-HOMATROPINE 5-1.5 MG/5ML PO SYRP: 5.0000 mL | ORAL_SOLUTION | Freq: Four times a day (QID) | ORAL | Status: DC | PRN

## 2014-07-30 MED ORDER — MELOXICAM 7.5 MG PO TABS: 7.5000 mg | ORAL_TABLET | Freq: Every day | ORAL | Status: DC | PRN

## 2014-07-30 NOTE — Assessment & Plan Note (Signed)
Stable on lexapro 10.

## 2014-07-30 NOTE — Progress Notes (Signed)
Pre visit review using our clinic review tool, if applicable. No additional management support is needed unless otherwise documented below in the visit note. 

## 2014-07-30 NOTE — Assessment & Plan Note (Signed)
I suspect GERD is contributing. She did not start PPI last visit.  Advised that she start PPI. Will obtain cxr to exlude pneumonia. They requested rx for hycodan syrup.  One bottle provided. Advised this should not be used long term. Recommended follow up with provider if symptoms do not improve and transition to otc med such as delsym as needed when hycodan complete.

## 2014-07-30 NOTE — Progress Notes (Signed)
   Subjective:    Patient ID: Hailey Rasmussen, female    DOB: 08/12/1950, 64 y.o.   MRN: [id number]  HPI  Hailey Rasmussen is a 64 yr old female who presents today for follow up. She will be returning to [place] in 2 days for a 3 month trip. She presents with her daughter who assists with translation and history.    1) Depression- on lexapro 10.  Daughter notes that she is moody. Overall mood has improved since she started lexapro per daughter.   2) Cough- has been present x 2 weeks.  Cough is worse at night. Coughs "all night long" per daughter. Trouble sleeping.  Had rx for hydrocodone/homatropine. No fever that they are aware of.  Denies nasal congestion. Cough is worse when she lays flat. Cough is described as wet.  Denies frank reflux symptoms.     3) Perforated TM- saw ENT, they are watching and will re-evaluate tomorrow.     Review of Systems See HPI  Past Medical History  Diagnosis Date  . Hyperlipidemia     History   Social History  . Marital Status: Married    Spouse Name: N/A    Number of Children: N/A  . Years of Education: N/A   Occupational History  . Not on file.   Social History Main Topics  . Smoking status: Never Smoker   . Smokeless tobacco: Never Used  . Alcohol Use: No  . Drug Use: Not on file  . Sexual Activity: Not on file   Other Topics Concern  . Not on file   Social History Narrative   Lives with her husband and son   She has 7 children (one daughter died at age 59) 7 children all live in [place]   Retired homemaker   She completed high school   Moved to [place] from [place] in 1997          History reviewed. No pertinent past surgical history.  Family History  Problem Relation Age of Onset  . Heart disease Father     No Known Allergies  No current outpatient prescriptions on file prior to visit.   No current facility-administered medications on file prior to visit.    BP 108/70 mmHg  Pulse 62  Temp(Src) 98.2 F (36.8 C) (Oral)  Resp 14   Ht 4' 11.5" (1.511 m)  Wt 112 lb 6.4 oz (50.984 kg)  BMI 22.33 kg/m2  SpO2 99%  LMP 07/27/1995       Objective:   Physical Exam  Constitutional: She is oriented to person, place, and time. She appears well-developed and well-nourished. No distress.  HENT:  Head: Normocephalic and atraumatic.  Mouth/Throat: Oropharynx is clear and moist.  Small perforation of left TM with some yellow fluid noted behind TM  Cardiovascular: Normal rate and regular rhythm.   No murmur heard. Pulmonary/Chest: Effort normal. No respiratory distress. She has no wheezes. She has no rales. She exhibits no tenderness.  Musculoskeletal: She exhibits no edema.  Lymphadenopathy:    She has no cervical adenopathy.  Neurological: She is alert and oriented to person, place, and time.  Psychiatric: She has a normal mood and affect. Her behavior is normal. Judgment and thought content normal.          Assessment & Plan:

## 2014-07-30 NOTE — Assessment & Plan Note (Signed)
She has follow up with ENT tomorrow.  Management per ENT

## 2014-07-30 NOTE — Patient Instructions (Signed)
Start omeprazole to help protect stomach and also to help hopefully with your cough. Please complete your chest x ray on the first floor. If you cough does not improve in the next week or so, please see a provider back home. Schedule pap smear after you return from your trip.

## 2014-07-31 DIAGNOSIS — H7292 Unspecified perforation of tympanic membrane, left ear: Secondary | ICD-10-CM | POA: Diagnosis not present

## 2014-07-31 DIAGNOSIS — H906 Mixed conductive and sensorineural hearing loss, bilateral: Secondary | ICD-10-CM | POA: Diagnosis not present

## 2014-08-02 ENCOUNTER — Encounter: Payer: Self-pay | Admitting: *Deleted

## 2014-08-07 ENCOUNTER — Other Ambulatory Visit: Payer: Self-pay | Admitting: Family

## 2014-10-23 ENCOUNTER — Ambulatory Visit (INDEPENDENT_AMBULATORY_CARE_PROVIDER_SITE_OTHER): Payer: Medicare Other | Admitting: Medical

## 2014-10-23 ENCOUNTER — Encounter: Payer: Self-pay | Admitting: Medical

## 2014-10-23 VITALS — BP 123/64 | HR 65 | Temp 98.4°F | Ht 59.5 in | Wt 112.6 lb

## 2014-10-23 DIAGNOSIS — F329 Major depressive disorder, single episode, unspecified: Secondary | ICD-10-CM

## 2014-10-23 DIAGNOSIS — F32A Depression, unspecified: Secondary | ICD-10-CM

## 2014-10-23 DIAGNOSIS — R059 Cough, unspecified: Secondary | ICD-10-CM

## 2014-10-23 DIAGNOSIS — R05 Cough: Secondary | ICD-10-CM | POA: Diagnosis not present

## 2014-10-23 MED ORDER — MELOXICAM 7.5 MG PO TABS: 7.5000 mg | ORAL_TABLET | Freq: Every day | ORAL | Status: DC

## 2014-10-23 MED ORDER — OMEPRAZOLE 40 MG PO CPDR: 40.0000 mg | DELAYED_RELEASE_CAPSULE | Freq: Every day | ORAL | Status: DC

## 2014-10-23 MED ORDER — LORATADINE 10 MG PO TABS: 10.0000 mg | ORAL_TABLET | Freq: Every day | ORAL | Status: DC

## 2014-10-23 MED ORDER — ESCITALOPRAM OXALATE 10 MG PO TABS: ORAL_TABLET | ORAL | Status: DC

## 2014-10-23 MED ORDER — FLUTICASONE PROPIONATE 50 MCG/ACT NA SUSP: 2.0000 | Freq: Every day | NASAL | Status: DC

## 2014-10-23 MED ORDER — ESCITALOPRAM OXALATE 10 MG PO TABS: 10.0000 mg | ORAL_TABLET | Freq: Every day | ORAL | Status: DC

## 2014-10-23 MED ORDER — MELOXICAM 7.5 MG PO TABS: 7.5000 mg | ORAL_TABLET | Freq: Every day | ORAL | Status: DC | PRN

## 2014-10-23 NOTE — Assessment & Plan Note (Signed)
Stable and refilled lexapro.

## 2014-10-23 NOTE — Assessment & Plan Note (Signed)
Cough that his been chronic. Cxr has been done. Various differential dx explained. By exam +post nasal drainage and boggy turbinates. Rx claritin and flonase. With rx cost may be minimal at [facility] $4 list. If other pharmacy charges a lot then get otc.  Use above meds x 2 wks. Then if not helping use omepazole as Melissa advised.(Daily).  Go ahead and schedule appointment in one month with Melissa.   Pulmonoligist referal offered today and declined. But this may be necessary if cough persist.

## 2014-10-23 NOTE — Progress Notes (Signed)
Pre visit review using our clinic review tool, if applicable. No additional management support is needed unless otherwise documented below in the visit note. 

## 2014-10-23 NOTE — Progress Notes (Signed)
Subjective:    Patient ID: Hailey Rasmussen, female    DOB: 1951/05/07, 64 y.o.   MRN: [id number]  HPI  Pt in with son. He is interpreting. Pt has been coughing for maybe 5 months. Prior pcp thought that her cough was gerd related. Pt only takes the omprazole when she needs med. She takes med about 2 times a week. She never took then [employer] on a day to day basis.   Pt denies any sneezing, itching eyes or runny nose.   Pt cxr done ordered in January was negative.  Pt cough is more at night. No wheezing. No sob. Pt never smoker. No hx of asthma. Or use of inhalers.   Pt went to other clinic apart from [facility]. Med given there did not help much.   Pt also needs refill of her lexapro. Pt has hx of depression. She is doing well with medication.    Review of Systems  Constitutional: Negative for fever, chills and fatigue.  HENT: Negative.   Respiratory: Positive for cough. Negative for chest tightness, shortness of breath and wheezing.        More/mostly at night.  Cardiovascular: Negative for chest pain and palpitations.  Gastrointestinal: Negative for nausea, vomiting, abdominal pain and diarrhea.       Rare reflux.  Genitourinary: Negative for frequency, hematuria, flank pain, enuresis and pelvic pain.  Musculoskeletal: Negative for arthralgias and gait problem.  Skin: Negative for pallor and rash.  Hematological: Negative for adenopathy. Does not bruise/bleed easily.   Past Medical History  Diagnosis Date  . Hyperlipidemia     History   Social History  . Marital Status: Married    Spouse Name: N/A  . Number of Children: N/A  . Years of Education: N/A   Occupational History  . Not on file.   Social History Main Topics  . Smoking status: Never Smoker   . Smokeless tobacco: Never Used  . Alcohol Use: No  . Drug Use: Not on file  . Sexual Activity: Not on file   Other Topics Concern  . Not on file   Social History Narrative   Lives with her husband and son   She  has 7 children (one daughter died at age 25) 55 children all live in [place]   Retired homemaker   She completed high school   Moved to [place] from [place] in 1997          No past surgical history on file.  Family History  Problem Relation Age of Onset  . Heart disease Father     No Known Allergies  No current outpatient prescriptions on file prior to visit.   No current facility-administered medications on file prior to visit.    BP 123/64 mmHg  Pulse 65  Temp(Src) 98.4 F (36.9 C) (Oral)  Ht 4' 11.5" (1.511 m)  Wt 112 lb 9.6 oz (51.075 kg)  BMI 22.37 kg/m2  SpO2 98%  LMP 07/27/1995      Objective:   Physical Exam   General  Mental Status - Alert. General Appearance - Well groomed. Not in acute distress.  Skin Rashes- No Rashes.  HEENT Head- Normal. Ear Auditory Canal - Left- Normal. Right - Normal.Tympanic Membrane- Left- Normal. Right- Normal. Eye Sclera/Conjunctiva- Left- Normal. Right- Normal. Nose & Sinuses Nasal Mucosa- Left-  Boggy and Congested. Right-  Boggy and  Congested.Bilateral maxillary and frontal sinus pressure. Mouth & Throat Lips: Upper Lip- Normal: no dryness, cracking, pallor, cyanosis, or vesicular  eruption. Lower Lip-Normal: no dryness, cracking, pallor, cyanosis or vesicular eruption. Buccal Mucosa- Bilateral- No Aphthous ulcers. Oropharynx- No Discharge or Erythema. +pnd Tonsils: Characteristics- Bilateral- No Erythema or Congestion. Size/Enlargement- Bilateral- No enlargement. Discharge- bilateral-None.  Neck Neck- Supple. No Masses.   Chest and Lung Exam Auscultation: Breath Sounds:-Clear even and unlabored.  Cardiovascular Auscultation:Rythm- Regular, rate and rhythm. Murmurs & Other Heart Sounds:Ausculatation of the heart reveal- No Murmurs.  Lymphatic Head & Neck General Head & Neck Lymphatics: Bilateral: Description- No Localized lymphadenopathy.   Abdomen Inspection:-Inspection Normal.  Palpation/Perucssion: Palpation  and Percussion of the abdomen reveal- Non Tender, No Rebound tenderness, No rigidity(Guarding) and No Palpable abdominal masses.  Liver:-Normal.  Spleen:- Normal.         Assessment & Plan:

## 2014-10-23 NOTE — Patient Instructions (Addendum)
Cough Cough that his been chronic. Cxr has been done. Various differential dx explained. By exam +post nasal drainage and boggy turbinates. Rx claritin and flonase. With rx cost may be minimal at [facility] $4 list. If other pharmacy charges a lot then get otc.  Use above meds x 2 wks. Then if not helping use omepazole as Melissa advised.(Daily).  Go ahead and schedule appointment in one month with Melissa.   Pulmonoligist referal offered today and declined. But this may be necessary if cough persist.   Depression Stable and refilled lexapro.     Follow up in one month with Melissa or as needed.  Not mobic and omeprazole also refilled today. I advised if possible just [place] mobic every other day. Currently using daily. I explained precaution.

## 2014-11-19 ENCOUNTER — Other Ambulatory Visit: Payer: Self-pay | Admitting: Medical

## 2014-11-20 NOTE — Telephone Encounter (Signed)
Question to staff on referral.

## 2014-11-27 DIAGNOSIS — H906 Mixed conductive and sensorineural hearing loss, bilateral: Secondary | ICD-10-CM | POA: Diagnosis not present

## 2015-04-15 ENCOUNTER — Encounter: Payer: Self-pay | Admitting: Family

## 2015-04-15 ENCOUNTER — Ambulatory Visit (INDEPENDENT_AMBULATORY_CARE_PROVIDER_SITE_OTHER): Payer: Medicare HMO | Admitting: Family

## 2015-04-15 ENCOUNTER — Ambulatory Visit (HOSPITAL_BASED_OUTPATIENT_CLINIC_OR_DEPARTMENT_OTHER)
Admission: RE | Admit: 2015-04-15 | Discharge: 2015-04-15 | Disposition: A | Discharge status: Home / Self Care | Payer: Medicare HMO | Source: Ambulatory Visit | Attending: Family | Admitting: Family

## 2015-04-15 ENCOUNTER — Telehealth: Payer: Self-pay | Admitting: Family

## 2015-04-15 VITALS — BP 126/70 | HR 67 | Temp 98.1°F | Resp 16 | Ht 59.5 in | Wt 114.2 lb

## 2015-04-15 DIAGNOSIS — J309 Allergic rhinitis, unspecified: Secondary | ICD-10-CM

## 2015-04-15 DIAGNOSIS — Z1239 Encounter for other screening for malignant neoplasm of breast: Secondary | ICD-10-CM

## 2015-04-15 DIAGNOSIS — Z1231 Encounter for screening mammogram for malignant neoplasm of breast: Secondary | ICD-10-CM | POA: Insufficient documentation

## 2015-04-15 DIAGNOSIS — K219 Gastro-esophageal reflux disease without esophagitis: Secondary | ICD-10-CM | POA: Diagnosis not present

## 2015-04-15 DIAGNOSIS — Z23 Encounter for immunization: Secondary | ICD-10-CM | POA: Diagnosis not present

## 2015-04-15 DIAGNOSIS — F329 Major depressive disorder, single episode, unspecified: Secondary | ICD-10-CM

## 2015-04-15 DIAGNOSIS — F32A Depression, unspecified: Secondary | ICD-10-CM

## 2015-04-15 MED ORDER — ESCITALOPRAM OXALATE 10 MG PO TABS: 10.0000 mg | ORAL_TABLET | Freq: Every day | ORAL | Status: DC

## 2015-04-15 MED ORDER — OMEPRAZOLE 40 MG PO CPDR: 40.0000 mg | DELAYED_RELEASE_CAPSULE | Freq: Every day | ORAL | Status: DC

## 2015-04-15 NOTE — Assessment & Plan Note (Signed)
Stable on PPI, continue same.  

## 2015-04-15 NOTE — Assessment & Plan Note (Signed)
Pt reports that she felt better on lexapro and wishes to resume. Will resume.

## 2015-04-15 NOTE — Progress Notes (Signed)
   Subjective:    Patient ID: Hailey Rasmussen, female    DOB: 10-23-1950, 64 y.o.   MRN: [id number]  HPI  Hailey Rasmussen is a 64 yr old female who presents today for follow up.  1) GERD- Reports well controlled on PPI.    2) Depression- pt ran out of lexapro. Son notes that the pt's mood has been good. Son notes no current issues with anxiety or irritability. Notes that she felt better on the lexapro and wishes to resume.   3) Allergic rhinitis-  Reports symptoms are well controlled on claritin and flonase.   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   Review of Systems    see HPI  Past Medical History  Diagnosis Date  . Hyperlipidemia     Social History   Social History  . Marital Status: Married    Spouse Name: N/A  . Number of Children: N/A  . Years of Education: N/A   Occupational History  . Not on file.   Social History Main Topics  . Smoking status: Never Smoker   . Smokeless tobacco: Never Used  . Alcohol Use: No  . Drug Use: Not on file  . Sexual Activity: Not on file   Other Topics Concern  . Not on file   Social History Narrative   Lives with her husband and son   She has 7 children (one daughter died at age 73) 57 children all live in [place]   Retired homemaker   She completed high school   Moved to [place] from [place] in 1997          No past surgical history on file.  Family History  Problem Relation Age of Onset  . Heart disease Father     No Known Allergies  Current Outpatient Prescriptions on File Prior to Visit  Medication Sig Dispense Refill  . escitalopram (LEXAPRO) 10 MG tablet Take 1 tablet (10 mg total) by mouth daily. 30 tablet 3  . fluticasone (FLONASE) 50 MCG/ACT nasal spray Place 2 sprays into both nostrils daily. 16 g 1  . loratadine (CLARITIN) 10 MG tablet TAKE 1 TABLET BY MOUTH EVERY DAY 30 tablet 0  . meloxicam (MOBIC) 7.5 MG  tablet Take 1 tablet (7.5 mg total) by mouth daily. 90 tablet 0  . omeprazole (PRILOSEC) 40 MG capsule Take 1 capsule (40 mg total) by mouth daily. 90 capsule 0   No current facility-administered medications on file prior to visit.    BP 126/70 mmHg  Pulse 67  Temp(Src) 98.1 F (36.7 C) (Oral)  Resp 16  Ht 4' 11.5" (1.511 m)  Wt 114 lb 3.2 oz (51.801 kg)  BMI 22.69 kg/m2  SpO2 98%  LMP 07/27/1995    Objective:   Physical Exam  Constitutional: She is oriented to person, place, and time. She appears well-developed and well-nourished.  HENT:  Head: Normocephalic and atraumatic.  Cardiovascular: Normal rate, regular rhythm and normal heart sounds.   No murmur heard. Pulmonary/Chest: Effort normal and breath sounds normal. No respiratory distress. She has no wheezes.  Neurological: She is alert and oriented to person, place, and time.  Psychiatric: She has a normal mood and affect. Her behavior is normal. Judgment and thought content normal.          Assessment & Plan:  Flu shot and prevnar today.

## 2015-04-15 NOTE — Telephone Encounter (Signed)
Could you please arrange a [place]et[place] interpreter for her next visit.

## 2015-04-15 NOTE — Assessment & Plan Note (Addendum)
Well controlled on claritin and flonase. Continue same.

## 2015-04-15 NOTE — Patient Instructions (Addendum)
Restart lexapro. Schedule mammogram on the first floor.

## 2015-04-15 NOTE — Progress Notes (Signed)
Pre visit review using our clinic review tool, if applicable. No additional management support is needed unless otherwise documented below in the visit note. 

## 2015-04-15 NOTE — Telephone Encounter (Signed)
Updated chart for interpreter needed.

## 2015-04-15 NOTE — Addendum Note (Signed)
Addended by: Kelle Darting A on: 04/15/2015 09:17 AM   Modules accepted: Orders

## 2015-04-21 ENCOUNTER — Telehealth: Payer: Self-pay | Admitting: *Deleted

## 2015-04-21 NOTE — Telephone Encounter (Signed)
Pt dropped off paperwork for Medical Certification for Disability Exceptions. Filled out as much as possible and forwarded to [facility]. JG//CMA

## 2015-04-29 NOTE — Telephone Encounter (Addendum)
Caller name: Nyoka Lint  Relation to pt: son  Call back number: [phone]    Reason for call:  Son checking on the status of forms, advised 5 to 7 business day turnaround

## 2015-04-29 NOTE — Telephone Encounter (Signed)
Forms currently with Melissa. Will contact pt when forms are ready. JG//CMA

## 2015-05-07 NOTE — Telephone Encounter (Signed)
Completed forms emailed to pt as requested. Copy sent for scanning. JG//CMA

## 2015-05-13 ENCOUNTER — Ambulatory Visit (INDEPENDENT_AMBULATORY_CARE_PROVIDER_SITE_OTHER): Payer: Medicare HMO

## 2015-05-13 VITALS — BP 122/74 | HR 66 | Ht 59.0 in | Wt 113.2 lb

## 2015-05-13 DIAGNOSIS — Z1159 Encounter for screening for other viral diseases: Secondary | ICD-10-CM

## 2015-05-13 DIAGNOSIS — Z0000 Encounter for general adult medical examination without abnormal findings: Secondary | ICD-10-CM | POA: Diagnosis not present

## 2015-05-13 NOTE — Patient Instructions (Addendum)
Stop by lab for Hep C screening.   Complete Bone Density Scan by next year.  Consider Cologuard as an alternative to Colonoscopy.  Follow up with Debbrah Alar as scheduled.   Continue to exercise and eat healthy.     Bone Densitometry Bone densitometry is an imaging test that uses a special X-ray to measure the amount of calcium and other minerals in your bones (bone density). This test is also known as a bone mineral density test or dual-energy X-ray absorptiometry (DXA). The test can measure bone density at your hip and your spine. It is similar to having a regular X-ray. You may have this test to:  Diagnose a condition that causes weak or thin bones (osteoporosis).  Predict your risk of a broken bone (fracture).  Determine how well osteoporosis treatment is working. LET [facility] CARE PROVIDER KNOW ABOUT:  Any allergies you have.  All medicines you are taking, including vitamins, herbs, eye drops, creams, and over-the-counter medicines.  Previous problems you or members of your family have had with the use of anesthetics.  Any blood disorders you have.  Previous surgeries you have had.  Medical conditions you have.  Possibility of pregnancy.  Any other medical test you had within the previous 14 days that used contrast material. RISKS AND COMPLICATIONS Generally, this is a safe procedure. However, problems can occur and may include the following:  This test exposes you to a very small amount of radiation.  The risks of radiation exposure may be greater to unborn children. BEFORE THE PROCEDURE  Do not take any calcium supplements for 24 hours before having the test. You can otherwise eat and drink what you usually do.  Take off all metal jewelry, eyeglasses, dental appliances, and any other metal objects. PROCEDURE  You may lie on an exam table. There will be an X-ray generator below you and an imaging device above you.  Other devices, such as boxes or  braces, may be used to position your body properly for the scan.  You will need to lie still while the machine slowly scans your body.  The images will show up on a computer monitor. AFTER THE PROCEDURE You may need more testing at a later time.   This information is not intended to replace advice given to you by your health care provider. Make sure you discuss any questions you have with your health care provider.   Document Released: 08/03/2004 Document Revised: 08/02/2014 Document Reviewed: 12/20/2013 Elsevier Interactive Patient Education 2016 [employer].  Colonoscopy A colonoscopy is an exam to look at the entire large intestine (colon). This exam can help find problems such as tumors, polyps, inflammation, and areas of bleeding. The exam takes about 1 hour.  LET [facility] CARE PROVIDER KNOW ABOUT:   Any allergies you have.  All medicines you are taking, including vitamins, herbs, eye drops, creams, and over-the-counter medicines.  Previous problems you or members of your family have had with the use of anesthetics.  Any blood disorders you have.  Previous surgeries you have had.  Medical conditions you have. RISKS AND COMPLICATIONS  Generally, this is a safe procedure. However, as with any procedure, complications can occur. Possible complications include:  Bleeding.  Tearing or rupture of the colon wall.  Reaction to medicines given during the exam.  Infection (rare). BEFORE THE PROCEDURE   Ask your health care provider about changing or stopping your regular medicines.  You may be prescribed an oral bowel prep. This involves  drinking a large amount of medicated liquid, starting the day before your procedure. The liquid will cause you to have multiple loose stools until your stool is almost clear or light green. This cleans out your colon in preparation for the procedure.  Do not eat or drink anything else once you have started the bowel prep, unless your  health care provider tells you it is safe to do so.  Arrange for someone to drive you home after the procedure. PROCEDURE   You will be given medicine to help you relax (sedative).  You will lie on your side with your knees bent.  A long, flexible tube with a light and camera on the end (colonoscope) will be inserted through the rectum and into the colon. The camera sends video back to a computer screen as it moves through the colon. The colonoscope also releases carbon dioxide gas to inflate the colon. This helps your health care provider see the area better.  During the exam, your health care provider may take a small tissue sample (biopsy) to be examined under a microscope if any abnormalities are found.  The exam is finished when the entire colon has been viewed. AFTER THE PROCEDURE   Do not drive for 24 hours after the exam.  You may have a small amount of blood in your stool.  You may pass moderate amounts of gas and have mild abdominal cramping or bloating. This is caused by the gas used to inflate your colon during the exam.  Ask when your test results will be ready and how you will get your results. Make sure you get your test results.   This information is not intended to replace advice given to you by your health care provider. Make sure you discuss any questions you have with your health care provider.   Document Released: 07/09/2000 Document Revised: 05/02/2013 Document Reviewed: 03/19/2013 Elsevier Interactive Patient Education [employer].

## 2015-05-13 NOTE — Progress Notes (Addendum)
Subjective:   Hailey Rasmussen is a 64 y.o. female who presents for an Initial Medicare Annual Wellness Visit.   Pt speaks [place].  She is here with her son who speaks [place].    Review of Systems: No ROS  Cardiac Risk Factors include: advanced age (>37men, >46 women)  Sleep patterns:   Sleeps approx. 6-8 hours per night/May take naps during the day/Wakes up twice during the night to use the bathroom.    Home Safety/Smoke Alarms:  Feels safe at home. She lives with husband and son. Smoke alarms present.  Firearm Safety: No firearms.  Seat Belt Safety/Bike Helmet:  Always wears seat belt.   Counseling:   Eye Exam- Never had one. Not interested in eye exam at this time.  Discussed the importance of eye exam.   Dental- Every 2 years.  Typically has dental work done in [place]. Female:  Mammo-04/17/15-negative   Dexa scan- DUE     CCS- Pt declined at this time. Discussed importance of completing CCS.  Pt still declines, but says she would consider Cologuard. Cologuard brochures given to patient.     Objective:    Today's Vitals   05/13/15 0904  BP: 122/74  Pulse: 66  Height: 4\' 11"  (1.499 m)  Weight: 113 lb 3.2 oz (51.347 kg)  SpO2: 96%  PainSc: 0-No pain    Current Medications (verified) Outpatient Encounter Prescriptions as of 05/13/2015  Medication Sig  . escitalopram (LEXAPRO) 10 MG tablet Take 1 tablet (10 mg total) by mouth daily.  . fluticasone (FLONASE) 50 MCG/ACT nasal spray Place 2 sprays into both nostrils daily.  Marland Kitchen omeprazole (PRILOSEC) 40 MG capsule Take 1 capsule (40 mg total) by mouth daily.  Marland Kitchen loratadine (CLARITIN) 10 MG tablet TAKE 1 TABLET BY MOUTH EVERY DAY (Patient not taking: Reported on 05/13/2015)  . meloxicam (MOBIC) 7.5 MG tablet Take 1 tablet (7.5 mg total) by mouth daily. (Patient not taking: Reported on 05/13/2015)   No facility-administered encounter medications on file as of 05/13/2015.    Allergies (verified) Review of patient's allergies  indicates no known allergies.   History: Past Medical History  Diagnosis Date  . Hyperlipidemia    History reviewed. No pertinent past surgical history. Family History  Problem Relation Age of Onset  . Heart disease Father    Social History   Occupational History  . Not on file.   Social History Main Topics  . Smoking status: Never Smoker   . Smokeless tobacco: Never Used  . Alcohol Use: No  . Drug Use: Not on file  . Sexual Activity: Not on file    Tobacco Counseling Counseling given: Not Answered   Activities of Daily Living In your present state of health, do you have any difficulty performing the following activities: 05/13/2015  Hearing? Y  Vision? N  Difficulty concentrating or making decisions? Y  Walking or climbing stairs? N  Dressing or bathing? N  Doing errands, shopping? Y  Preparing Food and eating ? N  Using the Toilet? N  In the past six months, have you accidently leaked urine? N  Do you have problems with loss of bowel control? N  Managing your Medications? N  Managing your Finances? Y  Housekeeping or managing your Housekeeping? N    Immunizations and Health Maintenance Immunization History  Administered Date(s) Administered  . Influenza,inj,Quad PF,36+ Mos 04/05/2014, 04/15/2015  . Pneumococcal Conjugate-13 04/15/2015  . Pneumococcal Polysaccharide-23 03/04/2014  . Tdap 07/26/2013  . Zoster 04/05/2014  Health Maintenance Due  Topic Date Due  . Hepatitis C Screening  Nov 26, 1950  . COLONOSCOPY  01/11/1999  . DEXA SCAN  01/10/2014    Patient Care Team: Debbrah Alar, NP as PCP - General (Internal Medicine)  Indicate any recent Medical Services you may have received from other than [facility] providers in the past year (date may be approximate).     Assessment:   This is a routine wellness examination for Hailey Rasmussen.   Depression- Stable on lexapro.  Scored 0/2 on PHQ 2 scale.    Hyperlipidemia- Last checked on 03/04/14.  Slightly  elevated.  Encouraged healthy diet and exercise.  Followed by PCP.  Hearing/Vision screen Hearing Screening Comments: Has hearing aid for left ear---sometimes wears it.  Right ear completely deaf.   Vision Screening Comments: No changes in vision.  Never had an eye exam.  Not interested in eye exam.   Dietary issues and exercise activities discussed: Current Exercise Habits:: Home exercise routine, Type of exercise: walking (gardening ), Time (Minutes): 30, Frequency (Times/Week): 3, Weekly Exercise (Minutes/Week): 90   Diet- per son--Pt eats 3 meals per day.  Mostly rice, vegetables and fish.    Goals    . Complete Bone Density Scan Within 1 year.     . Increase physical activity     Walking and gardening more.       Depression Screen PHQ 2/9 Scores 05/13/2015 03/08/2014 03/04/2014  PHQ - 2 Score 0 0 0    Fall Risk Fall Risk  05/13/2015 03/08/2014 03/04/2014 03/04/2014  Falls in the past year? No No No No    Cognitive Function: Alert. Great eye contact.  Dressed appropriately.  Pleasant mood.  Unable to complete MMSE due to language barrier.  Per son, pt has mild memory loss, and does not respond as quickly in conversation.  However, "her memory is not that bad." He also mentioned that patient has difficult with concentrating  sometimes. She's working on [employer], but struggles with learning and retaining the information required for the exam.      Screening Tests Health Maintenance  Topic Date Due  . Hepatitis C Screening  11-27-1950  . COLONOSCOPY  01/11/1999  . DEXA SCAN  01/10/2014  . INFLUENZA VACCINE  02/24/2016  . MAMMOGRAM  04/14/2017  . TETANUS/TDAP  07/27/2023  . ZOSTAVAX  Completed  . PNA vac Low Risk Adult  Completed      Plan:  Stop by lab for Hep C screening.   Complete Bone Density Scan by next year.  Consider Cologuard as an alternative to Colonoscopy.  Continue to exercise and eat healthy.  Follow up with Debbrah Alar  as scheduled.    During the course of the visit, Traniece was educated and counseled about the following appropriate screening and preventive services:   Vaccines to include Pneumoccal, Influenza, Hepatitis B, Td, Zostavax, HCV  Electrocardiogram  Cardiovascular disease screening  Colorectal cancer screening  Bone density screening  Diabetes screening  Glaucoma screening  Mammography/PAP  Nutrition counseling  Smoking cessation counseling  Patient Instructions (the written plan) were given to the patient.    Rudene Anda, RN   05/13/2015

## 2015-05-13 NOTE — Progress Notes (Signed)
Pre visit review using our clinic review tool, if applicable. No additional management support is needed unless otherwise documented below in the visit note. 

## 2015-05-13 NOTE — Telephone Encounter (Signed)
Son stated he never received email.  Hard copy given to son during mother's AWV today.

## 2015-05-14 ENCOUNTER — Encounter: Payer: Self-pay | Admitting: Family

## 2015-05-14 LAB — HEPATITIS C ANTIBODY: HCV Ab: NEGATIVE

## 2015-05-23 NOTE — Progress Notes (Signed)
Medicare wellness is reviewed.

## 2015-06-13 IMAGING — CR DG CHEST 2V
2 series · 2 of 2 positions shown · non-contrast
Comparison: 05/24/2008

CLINICAL DATA: Cough. Two week history of cough, rule out
pneumonia.

EXAM:
CHEST  2 VIEW

[w chest pa]
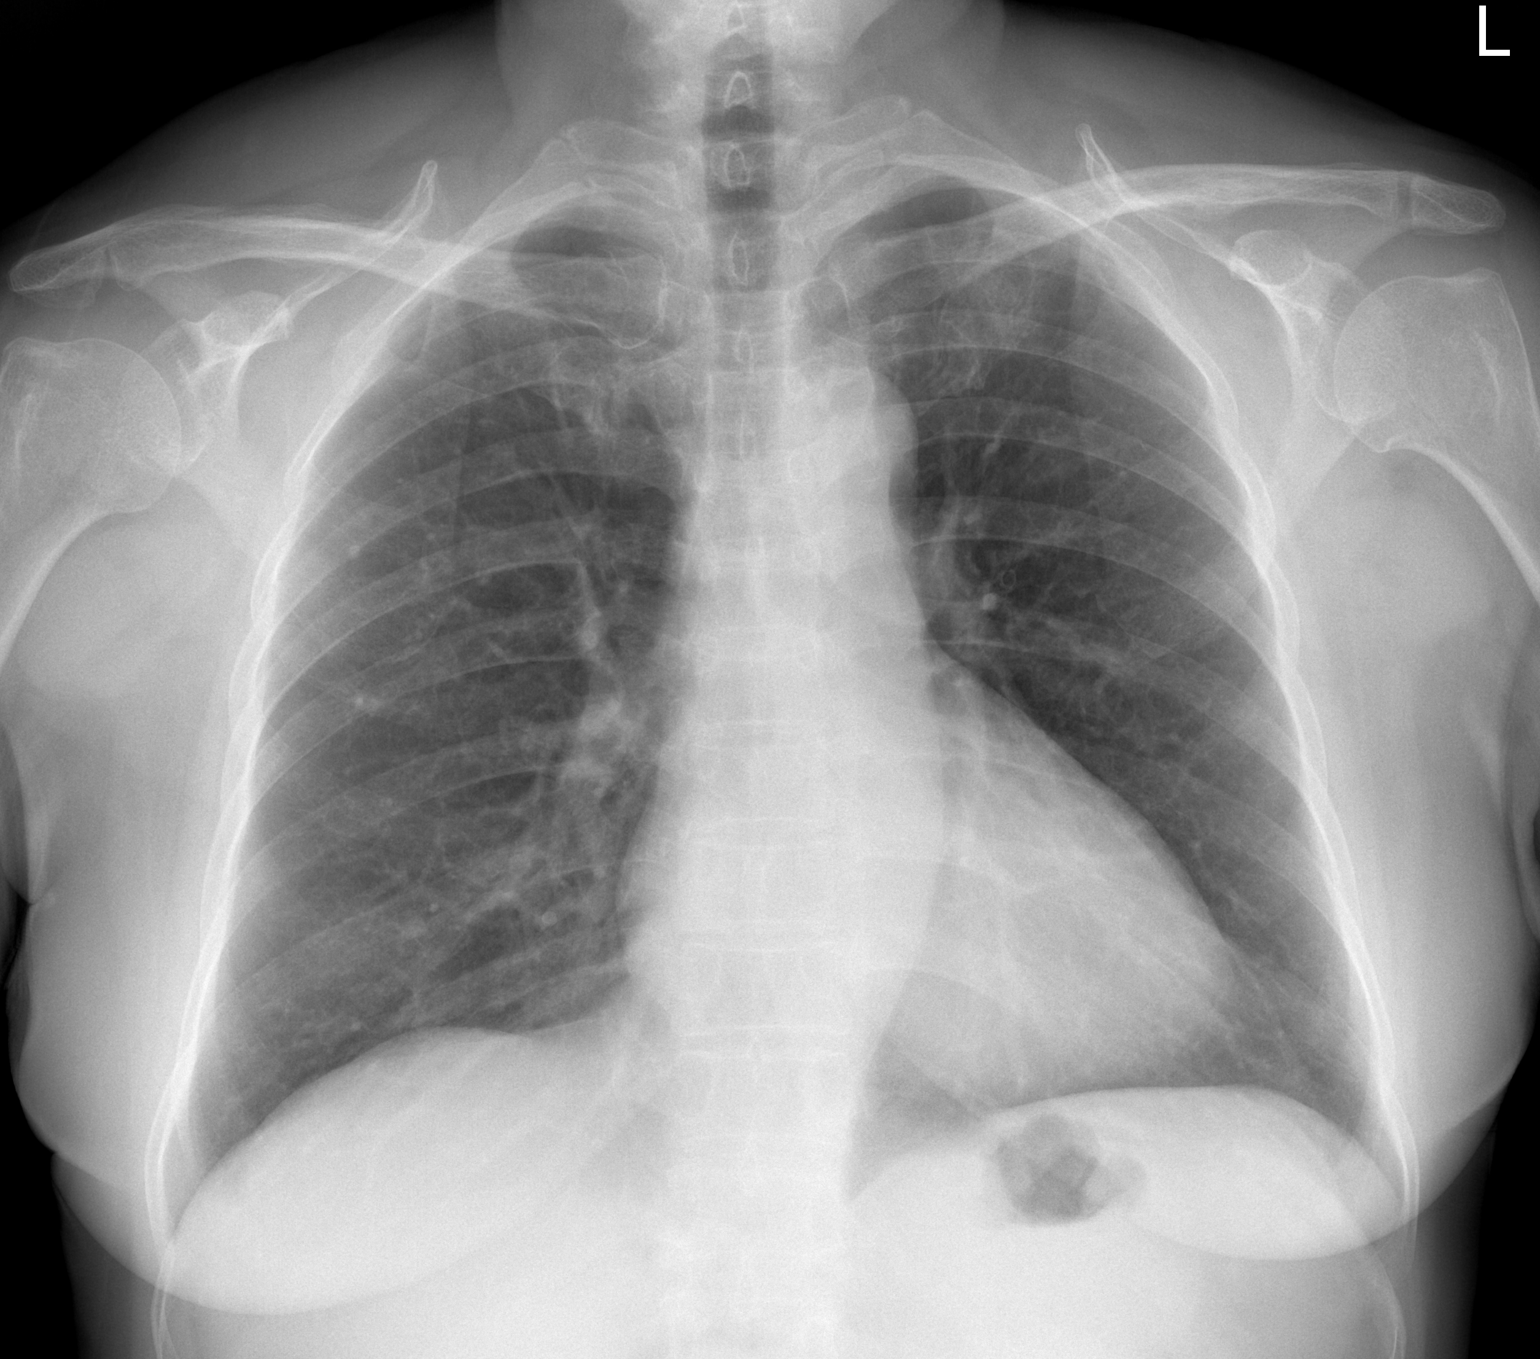

[w chest lat]
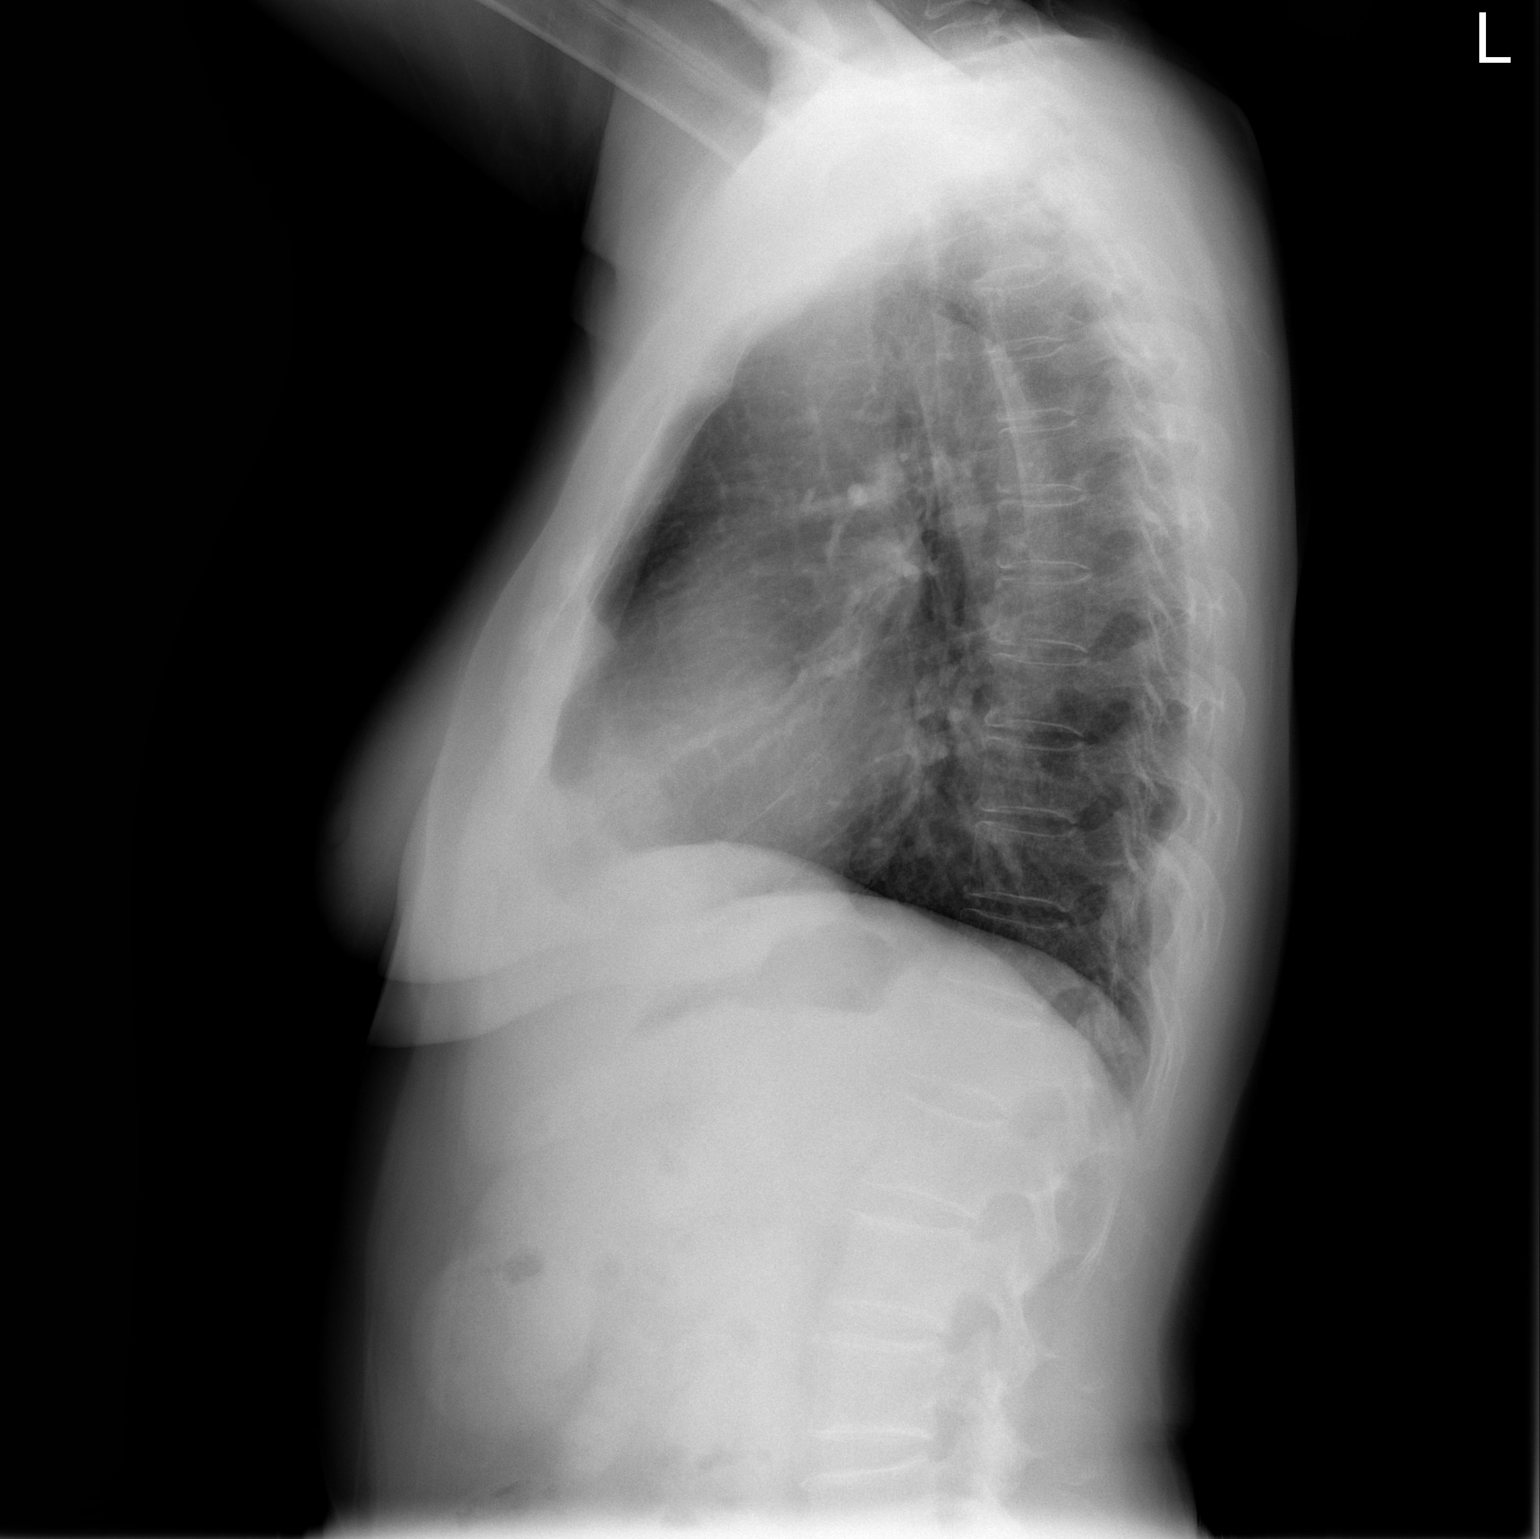

[2 of 2 positions shown; findings below may reference images not displayed]

FINDINGS: Small calcified granulomas in the periphery of the right lung. No
consolidation to suggest pneumonia. The heart is at the upper limits
of normal in size. Mediastinal contours are normal. Pulmonary
vasculature is normal. No pleural effusion or pneumothorax. No acute
osseous abnormalities are seen.
IMPRESSION: 1. No acute pulmonary process, no consolidation to suggest
pneumonia.
2. Sequela of prior granulomatous disease, unchanged from prior.

## 2015-10-13 ENCOUNTER — Encounter: Payer: Self-pay | Admitting: Family

## 2015-10-13 ENCOUNTER — Ambulatory Visit (INDEPENDENT_AMBULATORY_CARE_PROVIDER_SITE_OTHER): Payer: Medicare HMO | Admitting: Family

## 2015-10-13 VITALS — BP 132/57 | HR 63 | Temp 97.8°F | Resp 16 | Ht 59.0 in | Wt 116.6 lb

## 2015-10-13 DIAGNOSIS — F329 Major depressive disorder, single episode, unspecified: Secondary | ICD-10-CM

## 2015-10-13 DIAGNOSIS — J309 Allergic rhinitis, unspecified: Secondary | ICD-10-CM

## 2015-10-13 DIAGNOSIS — F32A Depression, unspecified: Secondary | ICD-10-CM

## 2015-10-13 MED ORDER — ESCITALOPRAM OXALATE 10 MG PO TABS: 10.0000 mg | ORAL_TABLET | Freq: Every day | ORAL | Status: DC

## 2015-10-13 MED ORDER — MONTELUKAST SODIUM 10 MG PO TABS: 10.0000 mg | ORAL_TABLET | Freq: Every day | ORAL | Status: DC

## 2015-10-13 NOTE — Progress Notes (Signed)
Pre visit review using our clinic review tool, if applicable. No additional management support is needed unless otherwise documented below in the visit note. 

## 2015-10-13 NOTE — Patient Instructions (Signed)
Use claritin and flonase every day. Add singulair once daily for cough. Call if cough worsen, or if you develop fever or if cough does not improve.

## 2015-10-13 NOTE — Assessment & Plan Note (Signed)
Stable on lexapro. Cont same.

## 2015-10-13 NOTE — Assessment & Plan Note (Signed)
I think that allergic rhinitis is the cause for her cough.  Add singulair, follow up in 1 month.

## 2015-10-13 NOTE — Progress Notes (Signed)
Subjective:    Patient ID: Hailey Rasmussen, female    DOB: 04-21-1951, 65 y.o.   MRN: [id number]  HPI  Hailey Rasmussen is a 65 yr old female who presents today to discuss cough. She presents today with her son.  Cough has been present x 1 month.  Cough is worse at night. She does have a history of allergic rhinitis (treated with claritin and flonase) as well as GERD which is treated with omeprazole. She denies fevers, does not feel ill, cough is productive at times.  She denies post nasal drip. Denies wheezing.    Depression- currently maintained on lexapro 10mg . Son notes that her mood is stable and improved.    Review of Systems See HPI  Past Medical History  Diagnosis Date  . Hyperlipidemia     Social History   Social History  . Marital Status: Married    Spouse Name: N/A  . Number of Children: N/A  . Years of Education: N/A   Occupational History  . Not on file.   Social History Main Topics  . Smoking status: Never Smoker   . Smokeless tobacco: Never Used  . Alcohol Use: No  . Drug Use: Not on file  . Sexual Activity: Not on file   Other Topics Concern  . Not on file   Social History Narrative   Lives with her husband and son   She has 7 children (one daughter died at age 80) 62 children all live in [place]   Retired homemaker   She completed high school   Moved to [place] from [place] in 1997          No past surgical history on file.  Family History  Problem Relation Age of Onset  . Heart disease Father     No Known Allergies  Current Outpatient Prescriptions on File Prior to Visit  Medication Sig Dispense Refill  . escitalopram (LEXAPRO) 10 MG tablet Take 1 tablet (10 mg total) by mouth daily. 90 tablet 1  . fluticasone (FLONASE) 50 MCG/ACT nasal spray Place 2 sprays into both nostrils daily. (Patient taking differently: Place 2 sprays into both nostrils daily as needed. ) 16 g 1  . loratadine (CLARITIN) 10 MG tablet TAKE 1 TABLET BY MOUTH EVERY DAY (Patient  taking differently: TAKE 1 TABLET BY MOUTH EVERY DAY AS NEEDED) 30 tablet 0  . meloxicam (MOBIC) 7.5 MG tablet Take 1 tablet (7.5 mg total) by mouth daily. (Patient taking differently: Take 7.5 mg by mouth daily as needed. ) 90 tablet 0  . omeprazole (PRILOSEC) 40 MG capsule Take 1 capsule (40 mg total) by mouth daily. (Patient taking differently: Take 40 mg by mouth daily as needed. ) 90 capsule 1   No current facility-administered medications on file prior to visit.    BP 132/57 mmHg  Pulse 63  Temp(Src) 97.8 F (36.6 C) (Oral)  Resp 16  Ht 4\' 11"  (1.499 m)  Wt 116 lb 9.6 oz (52.889 kg)  BMI 23.54 kg/m2  SpO2 100%  LMP 07/27/1995       Objective:   Physical Exam  Constitutional: She is oriented to person, place, and time. She appears well-developed and well-nourished.  HENT:  Head: Normocephalic and atraumatic.  + nasal congestion/blowing nose.   Cardiovascular: Normal rate, regular rhythm and normal heart sounds.   No murmur heard. Pulmonary/Chest: Effort normal and breath sounds normal. No respiratory distress. She has no wheezes.  Musculoskeletal: She exhibits no edema.  Neurological: She  is alert and oriented to person, place, and time.  Skin: Skin is warm and dry.  Psychiatric: She has a normal mood and affect. Her behavior is normal. Judgment and thought content normal.          Assessment & Plan:

## 2015-11-10 ENCOUNTER — Ambulatory Visit: Payer: Medicare HMO | Admitting: Family

## 2015-11-10 ENCOUNTER — Telehealth: Payer: Self-pay | Admitting: Family

## 2015-11-10 ENCOUNTER — Ambulatory Visit (INDEPENDENT_AMBULATORY_CARE_PROVIDER_SITE_OTHER): Payer: Medicare HMO | Admitting: Family

## 2015-11-10 ENCOUNTER — Encounter: Payer: Self-pay | Admitting: Family

## 2015-11-10 ENCOUNTER — Ambulatory Visit (HOSPITAL_BASED_OUTPATIENT_CLINIC_OR_DEPARTMENT_OTHER)
Admission: RE | Admit: 2015-11-10 | Discharge: 2015-11-10 | Disposition: A | Discharge status: Home / Self Care | Payer: Medicare HMO | Source: Ambulatory Visit | Attending: Family | Admitting: Family

## 2015-11-10 VITALS — BP 120/59 | HR 69 | Temp 98.4°F | Resp 16 | Ht 59.5 in | Wt 117.0 lb

## 2015-11-10 DIAGNOSIS — R053 Chronic cough: Secondary | ICD-10-CM | POA: Insufficient documentation

## 2015-11-10 DIAGNOSIS — R059 Cough, unspecified: Secondary | ICD-10-CM

## 2015-11-10 DIAGNOSIS — R05 Cough: Secondary | ICD-10-CM

## 2015-11-10 DIAGNOSIS — R918 Other nonspecific abnormal finding of lung field: Secondary | ICD-10-CM | POA: Insufficient documentation

## 2015-11-10 MED ORDER — BENZONATATE 100 MG PO CAPS: 100.0000 mg | ORAL_CAPSULE | Freq: Three times a day (TID) | ORAL | Status: DC | PRN

## 2015-11-10 MED ORDER — FLUTICASONE PROPIONATE 50 MCG/ACT NA SUSP: 2.0000 | Freq: Every day | NASAL | Status: DC

## 2015-11-10 MED ORDER — AZITHROMYCIN 250 MG PO TABS: ORAL_TABLET | ORAL | Status: DC

## 2015-11-10 NOTE — Progress Notes (Signed)
Pre visit review using our clinic review tool, if applicable. No additional management support is needed unless otherwise documented below in the visit note. 

## 2015-11-10 NOTE — Telephone Encounter (Signed)
Please let son know that her CXR shows possible PNA.  I would like her to start zpak. Follow up in 2 weeks.  I will hold off on pulmonary referral for now. We can re-order in 2 weeks if cough is not improved.

## 2015-11-10 NOTE — Addendum Note (Signed)
Addended by: Debbrah Alar on: 11/10/2015 04:34 PM   Modules accepted: Orders

## 2015-11-10 NOTE — Telephone Encounter (Signed)
Notified pt's son and he voices understanding. F/u scheduled for 11/25/15 at 7:45am.

## 2015-11-10 NOTE — Assessment & Plan Note (Signed)
Advised patient as follows:   Stop singluair, start tessalon as needed for cough. Restart flonase nasal spray.  Complete chest x ray on the first floor. You will be contacted about your referral to pulmonology to further evaluate your cough.   Please follow up in 5 months.

## 2015-11-10 NOTE — Progress Notes (Signed)
   Subjective:    Patient ID: Hailey Rasmussen, female    DOB: [birth year], 65 y.o.   MRN: [id number]  HPI  Hailey Rasmussen is a 65 yr old female who presents today for follow up of cough. She is accompanied by her son who helps with translation.  Patient was seen on 10/13/15 with cough. We gave trial of singulair that time.  Per son, this is not helping. She reports good compliance with claritin and prilosec. Denies any post-nasal drip. However son would like to restart her flonase.   Denies fevers or night sweats.    Review of Systems    see HPI  Past Medical History  Diagnosis Date  . Hyperlipidemia      Social History   Social History  . Marital Status: Married    Spouse Name: N/A  . Number of Children: N/A  . Years of Education: N/A   Occupational History  . Not on file.   Social History Main Topics  . Smoking status: Never Smoker   . Smokeless tobacco: Never Used  . Alcohol Use: No  . Drug Use: Not on file  . Sexual Activity: Not on file   Other Topics Concern  . Not on file   Social History Narrative   Lives with her husband and son   She has 7 children (one daughter died at age 15) 20 children all live in [place]   Retired homemaker   She completed high school   Moved to [place] from [place] in 1997          No past surgical history on file.  Family History  Problem Relation Age of Onset  . Heart disease Father     No Known Allergies  Current Outpatient Prescriptions on File Prior to Visit  Medication Sig Dispense Refill  . escitalopram (LEXAPRO) 10 MG tablet Take 1 tablet (10 mg total) by mouth daily. 90 tablet 1  . loratadine (CLARITIN) 10 MG tablet TAKE 1 TABLET BY MOUTH EVERY DAY (Patient taking differently: TAKE 1 TABLET BY MOUTH EVERY DAY AS NEEDED) 30 tablet 0  . meloxicam (MOBIC) 7.5 MG tablet Take 1 tablet (7.5 mg total) by mouth daily. (Patient taking differently: Take 7.5 mg by mouth daily as needed. ) 90 tablet 0  . montelukast (SINGULAIR) 10 MG tablet Take  1 tablet (10 mg total) by mouth at bedtime. 30 tablet 3  . omeprazole (PRILOSEC) 40 MG capsule Take 1 capsule (40 mg total) by mouth daily. (Patient taking differently: Take 40 mg by mouth daily as needed. ) 90 capsule 1   No current facility-administered medications on file prior to visit.    BP 120/59 mmHg  Pulse 69  Temp(Src) 98.4 F (36.9 C) (Oral)  Resp 16  Ht 4' 11.5" (1.511 m)  Wt 117 lb (53.071 kg)  BMI 23.24 kg/m2  SpO2 97%  LMP 07/27/1995    Objective:   Physical Exam  Constitutional: She appears well-developed and well-nourished.  Cardiovascular: Normal rate, regular rhythm and normal heart sounds.   No murmur heard. Pulmonary/Chest: Effort normal and breath sounds normal. No respiratory distress. She has no wheezes.  Psychiatric: She has a normal mood and affect. Her behavior is normal. Judgment and thought content normal.          Assessment & Plan:

## 2015-11-10 NOTE — Patient Instructions (Signed)
Stop singluair, start tessalon as needed for cough. Restart flonase nasal spray.  Complete chest x ray on the first floor. You will be contacted about your referral to pulmonology to further evaluate your cough.   Please follow up in 5 months.

## 2015-11-25 ENCOUNTER — Encounter: Payer: Self-pay | Admitting: Family

## 2015-11-25 ENCOUNTER — Ambulatory Visit (HOSPITAL_BASED_OUTPATIENT_CLINIC_OR_DEPARTMENT_OTHER)
Admission: RE | Admit: 2015-11-25 | Discharge: 2015-11-25 | Disposition: A | Discharge status: Home / Self Care | Payer: Medicare HMO | Source: Ambulatory Visit | Attending: Family | Admitting: Family

## 2015-11-25 ENCOUNTER — Ambulatory Visit (INDEPENDENT_AMBULATORY_CARE_PROVIDER_SITE_OTHER): Payer: Medicare HMO | Admitting: Family

## 2015-11-25 VITALS — BP 121/63 | HR 59 | Temp 98.3°F | Resp 16 | Ht 59.5 in | Wt 116.0 lb

## 2015-11-25 DIAGNOSIS — R05 Cough: Secondary | ICD-10-CM | POA: Diagnosis not present

## 2015-11-25 DIAGNOSIS — J189 Pneumonia, unspecified organism: Secondary | ICD-10-CM | POA: Insufficient documentation

## 2015-11-25 DIAGNOSIS — R053 Chronic cough: Secondary | ICD-10-CM

## 2015-11-25 DIAGNOSIS — J181 Lobar pneumonia, unspecified organism: Secondary | ICD-10-CM

## 2015-11-25 NOTE — Progress Notes (Signed)
Subjective:    Patient ID: Hailey Rasmussen, female    DOB: [birth year], 65 y.o.   MRN: [id number]  HPI Hailey Rasmussen is a 65 yr old female who presents today with chief complaint of cough.  She was seen on 11/10/15 with c/o cough. She was also seen on 10/13/15 with c/o cough and was given rx for singulair which did not help.  Last visit she was given rx for tessalon prn and advised to begin flonase spray. CXR revealed atx versus PNA in the LLL. She was placed on zpak.   She presents today with her son. He reports that she completed zpak. Reports that cough persists and is unchanged.  She denies fever. Cough is described as wet at times.  She reports that her cough is no worse laying down than when she sits up. She denies rhinorrhea, itchy eyes.    Review of Systems    see HPI  Past Medical History  Diagnosis Date  . Hyperlipidemia      Social History   Social History  . Marital Status: Married    Spouse Name: N/A  . Number of Children: N/A  . Years of Education: N/A   Occupational History  . Not on file.   Social History Main Topics  . Smoking status: Never Smoker   . Smokeless tobacco: Never Used  . Alcohol Use: No  . Drug Use: Not on file  . Sexual Activity: Not on file   Other Topics Concern  . Not on file   Social History Narrative   Lives with her husband and son   She has 7 children (one daughter died at age 86) 98 children all live in [place]   Retired homemaker   She completed high school   Moved to [place] from [place] in 1997          No past surgical history on file.  Family History  Problem Relation Age of Onset  . Heart disease Father     No Known Allergies  Current Outpatient Prescriptions on File Prior to Visit  Medication Sig Dispense Refill  . escitalopram (LEXAPRO) 10 MG tablet Take 1 tablet (10 mg total) by mouth daily. 90 tablet 1  . fluticasone (FLONASE) 50 MCG/ACT nasal spray Place 2 sprays into both nostrils daily. 16 g 2  . loratadine (CLARITIN)  10 MG tablet TAKE 1 TABLET BY MOUTH EVERY DAY (Patient taking differently: TAKE 1 TABLET BY MOUTH EVERY DAY AS NEEDED) 30 tablet 0  . meloxicam (MOBIC) 7.5 MG tablet Take 1 tablet (7.5 mg total) by mouth daily. (Patient taking differently: Take 7.5 mg by mouth daily as needed. ) 90 tablet 0  . omeprazole (PRILOSEC) 40 MG capsule Take 1 capsule (40 mg total) by mouth daily. (Patient taking differently: Take 40 mg by mouth daily as needed. ) 90 capsule 1   No current facility-administered medications on file prior to visit.    BP 121/63 mmHg  Pulse 59  Temp(Src) 98.3 F (36.8 C) (Oral)  Resp 16  Ht 4' 11.5" (1.511 m)  Wt 116 lb (52.617 kg)  BMI 23.05 kg/m2  SpO2 98%  LMP 07/27/1995    Objective:   Physical Exam  Constitutional: She is oriented to person, place, and time. She appears well-developed and well-nourished.  HENT:  Head: Normocephalic and atraumatic.  Right Ear: Tympanic membrane and ear canal normal.  Left Ear: Tympanic membrane and ear canal normal.  Cardiovascular: Normal rate, regular rhythm and normal heart  sounds.   No murmur heard. Pulmonary/Chest: Effort normal and breath sounds normal. No respiratory distress. She has no wheezes.  Lymphadenopathy:    She has no cervical adenopathy.  Neurological: She is alert and oriented to person, place, and time.  Skin: Skin is warm and dry.  Psychiatric: She has a normal mood and affect. Her behavior is normal. Judgment and thought content normal.          Assessment & Plan:  Cough/LLL PNA- will obtain follow up CXR to ensure clearing.  Her cough was present for some time prior to finding of LLL ?infiltrate so I am not really certain that this is the cause for her cough. Will refer to pulmonology for further evaluation.

## 2015-11-25 NOTE — Patient Instructions (Signed)
Please complete x ray on the first floor. You will be contacted about your referral to the lung doctor.

## 2015-11-27 ENCOUNTER — Ambulatory Visit (INDEPENDENT_AMBULATORY_CARE_PROVIDER_SITE_OTHER): Payer: Medicare HMO | Admitting: Pulmonary Disease

## 2015-11-27 ENCOUNTER — Encounter: Payer: Self-pay | Admitting: Pulmonary Disease

## 2015-11-27 VITALS — BP 120/70 | HR 59 | Ht 59.5 in | Wt 115.0 lb

## 2015-11-27 DIAGNOSIS — J309 Allergic rhinitis, unspecified: Secondary | ICD-10-CM

## 2015-11-27 DIAGNOSIS — R05 Cough: Secondary | ICD-10-CM | POA: Diagnosis not present

## 2015-11-27 DIAGNOSIS — K219 Gastro-esophageal reflux disease without esophagitis: Secondary | ICD-10-CM

## 2015-11-27 DIAGNOSIS — R053 Chronic cough: Secondary | ICD-10-CM

## 2015-11-27 NOTE — Assessment & Plan Note (Signed)
Restart flonase 

## 2015-11-27 NOTE — Assessment & Plan Note (Signed)
Take PPI daily.

## 2015-11-27 NOTE — Patient Instructions (Signed)
1. Restart using Flonase, 2 squirts per nostril at bedtime. 2. Restart Omeprazole 1 tab daily.  3. May add Claritin 10 mg/tab daily if cough is not better with above meds.  Return to clinic in August.

## 2015-11-27 NOTE — Assessment & Plan Note (Addendum)
Cough since 09/2014. Better initially with flonase and omeprazole but worse recently. CXR (09/2015)  No infiltrate. No new meds, environment since cough worsened in 08/2015. Good fxn capacity. Some GERD sx. She uses the flonase >  Sprays into mouth.  Cough likely 2/2 UACS +/- allergies +/- GERD.   Plan : 1. Restart flonase 2 sq/nostril at hs 2. Re start omeprazole.  3. If not better in 2 weeks, add claritin OTC. 4. If not better on f/u, will need a chest ct scan to look for parenchymal dse. ? Nodular infiltrates.

## 2015-11-27 NOTE — Progress Notes (Signed)
Subjective:    Patient ID: Hailey Rasmussen, female    DOB: 1951/04/11, 65 y.o.   MRN: [id number]  HPI   This is the case of Hailey Rasmussen, 65 y.o. Female, who was referred by Debbrah Alar  in consultation regarding cough and dyspnea.   As you very well know, patient is a non smoker. Patient was born and raised in [place], has been here in [place] since 1997. Patient does not speak English so son, Timmothy Sours, interpreted for her. Pt denies any lung issues or allergies.  She is relatively healthy.  She had cough last year > was rx allergy meds and cough got better.  Her cough worsened in 08/2015. She took ZPAK and cough is the same.   Coughs every now and then.  Triggers: randomly happens.  Denies nasal congestion, SOB, fevers, chills, weight loss.  Denies abd pain.   Denies new meds or environment.  (-) pets.  Has carpet in bedroom. Cough is not worse in bedroom.    Review of Systems  Constitutional: Negative.  Negative for fever and unexpected weight change.  HENT: Positive for congestion. Negative for dental problem, ear pain, nosebleeds, postnasal drip, rhinorrhea, sinus pressure, sneezing, sore throat and trouble swallowing.   Eyes: Negative.  Negative for redness and itching.  Respiratory: Positive for cough. Negative for chest tightness, shortness of breath and wheezing.   Cardiovascular: Negative.  Negative for palpitations and leg swelling.  Gastrointestinal: Negative.  Negative for nausea and vomiting.  Genitourinary: Negative.  Negative for dysuria.  Musculoskeletal: Positive for arthralgias. Negative for joint swelling.  Skin: Negative.  Negative for rash.  Allergic/Immunologic: Positive for environmental allergies.  Neurological: Negative.  Negative for headaches.  Hematological: Negative.  Does not bruise/bleed easily.  Psychiatric/Behavioral: Negative.  Negative for dysphoric mood. The patient is not nervous/anxious.    Past Medical History  Diagnosis Date  .  Hyperlipidemia    (-) CA, DVT  Family History  Problem Relation Age of Onset  . Heart disease Father      No past surgical history on file.  (-) surgery.   Social History   Social History  . Marital Status: Married    Spouse Name: N/A  . Number of Children: N/A  . Years of Education: N/A   Occupational History  . Not on file.   Social History Main Topics  . Smoking status: Never Smoker   . Smokeless tobacco: Never Used  . Alcohol Use: No  . Drug Use: Not on file  . Sexual Activity: Not on file   Other Topics Concern  . Not on file   Social History Narrative   Lives with her husband and son   She has 7 children (one daughter died at age 50) 65 children all live in [place]   Retired homemaker   She completed high school   Moved to [place] from [place] in 1997           No Known Allergies   Outpatient Prescriptions Prior to Visit  Medication Sig Dispense Refill  . escitalopram (LEXAPRO) 10 MG tablet Take 1 tablet (10 mg total) by mouth daily. 90 tablet 1  . fluticasone (FLONASE) 50 MCG/ACT nasal spray Place 2 sprays into both nostrils daily. 16 g 2  . loratadine (CLARITIN) 10 MG tablet TAKE 1 TABLET BY MOUTH EVERY DAY (Patient taking differently: TAKE 1 TABLET BY MOUTH EVERY DAY AS NEEDED) 30 tablet 0  . meloxicam (MOBIC) 7.5 MG tablet Take 1 tablet (  7.5 mg total) by mouth daily. (Patient taking differently: Take 7.5 mg by mouth daily as needed. ) 90 tablet 0  . omeprazole (PRILOSEC) 40 MG capsule Take 1 capsule (40 mg total) by mouth daily. (Patient taking differently: Take 40 mg by mouth daily as needed. ) 90 capsule 1   No facility-administered medications prior to visit.   No orders of the defined types were placed in this encounter.          Objective:   Physical Exam   Vitals:  Filed Vitals:   11/27/15 1013  BP: 120/70  Pulse: 59  Height: 4' 11.5" (1.511 m)  Weight: 115 lb (52.164 kg)  SpO2: 95%    Constitutional/General:  Pleasant, well-nourished,  well-developed, not in any distress,  Comfortably seating.  Well kempt  Body mass index is 22.85 kg/(m^2). Wt Readings from Last 3 Encounters:  11/27/15 115 lb (52.164 kg)  11/25/15 116 lb (52.617 kg)  11/10/15 117 lb (53.071 kg)     HEENT: Pupils equal and reactive to light and accommodation. Anicteric sclerae. Normal nasal mucosa.   No oral  lesions,  mouth clear,  oropharynx clear, no postnasal drip. (-) Oral thrush. No dental caries.  Airway - Mallampati class III  Neck: No masses. Midline trachea. No JVD, (-) LAD. (-) bruits appreciated.  Respiratory/Chest: Grossly normal chest. (-) deformity. (-) Accessory muscle use.  Symmetric expansion. (-) Tenderness on palpation.  Resonant on percussion.  Diminished BS on both lower lung zones. (-) wheezing, crackles, rhonchi (-) egophony  Cardiovascular: Regular rate and  rhythm, heart sounds normal, no murmur or gallops, no peripheral edema  Gastrointestinal:  Normal bowel sounds. Soft, non-tender. No hepatosplenomegaly.  (-) masses.   Musculoskeletal:  Normal muscle tone. Normal gait.   Extremities: Grossly normal. (-) clubbing, cyanosis.  (-) edema  Skin: (-) rash,lesions seen.   Neurological/Psychiatric : alert, oriented to time, place, person. Normal mood and affect           Assessment & Plan:  Chronic cough Cough since 09/2014. Better initially with flonase and omeprazole but worse recently. CXR (09/2015)  No infiltrate. No new meds, environment since cough worsened in 08/2015. Good fxn capacity. Some GERD sx. She uses the flonase >  Sprays into mouth.  Cough likely 2/2 UACS +/- allergies +/- GERD.   Plan : 1. Restart flonase 2 sq/nostril at hs 2. Re start omeprazole.  3. If not better in 2 weeks, add claritin OTC. 4. If not better on f/u, will need a chest ct scan to look for parenchymal dse. ? Nodular infiltrates.   Allergic rhinitis Restart flonase.   GERD (gastroesophageal reflux disease) Take  PPI daily.      Thank you very much for letting me participate in this patient's care. Please do not hesitate to give me a call if you have any questions or concerns regarding the treatment plan.   Patient will follow up with me in August, sooner if cough is worse.    I personally reviewed previous images (Chest Xray, Chest Ct scan) done on this patient. I reviewed the reports on the images as well.     Monica Becton, MD 11/27/2015   11:00 AM [facility] and [place] Pager: [phone] Office: [phone], Fax: [phone]

## 2015-12-12 ENCOUNTER — Telehealth: Payer: Self-pay | Admitting: Family

## 2015-12-12 NOTE — Telephone Encounter (Signed)
Pt dropped off documents for disability certification, pt states will pick up, documents was placed in [place] in front office

## 2015-12-15 NOTE — Telephone Encounter (Signed)
Forwarded to Melissa. JG//CMA  

## 2015-12-18 NOTE — Telephone Encounter (Signed)
Please call pt's son and schedule visit which needs an interpretor present as noted below. Thanks, JG//CMA

## 2015-12-18 NOTE — Telephone Encounter (Signed)
Please contact pt's son and let him know that I have reviewed the form for his mom. This form is very specific and requires that I have a certified interpretor present and that I review and document her identification.  I would recommend that we book a visit with an interpretor to further discuss.  Also, the only diagnosis that we have to support her not being able to comply with their requirements is Depression.  I am happy to fill out but I am not sure that is going to qualify her.

## 2015-12-18 NOTE — Telephone Encounter (Signed)
Called and left detailed message for pt's son advising him of information below. JG//CMA

## 2015-12-18 NOTE — Telephone Encounter (Signed)
Patient is out of the country and will not be back before form is due which is 01/11/16. Please advise son best # [phone]

## 2015-12-18 NOTE — Telephone Encounter (Signed)
Please call son and let him know info below. I can fill Form and have ready tomorrow AM, however I doubt it will be accepted.

## 2015-12-23 NOTE — Telephone Encounter (Signed)
Notified pt's son that PCP cannot complete form in it's entirety without an office visit to view and verify pt's ID papers with a certified language interpreter present. He states pt is out of the country until 01/14/16 and form is due on 01/11/16. Advised him to see if she can get an extension. If so, advised him to call us back to schedule an appt and we will need to arrange for a language interpreter (cambodian) from [place] at that visit. Pt's son voices understanding and took incomplete forms with him. Advised him he will need to bring all paperwork back at next OV.

## 2016-01-22 ENCOUNTER — Encounter: Payer: Self-pay | Admitting: Family

## 2016-01-22 ENCOUNTER — Ambulatory Visit (INDEPENDENT_AMBULATORY_CARE_PROVIDER_SITE_OTHER): Payer: Medicare HMO | Admitting: Family

## 2016-01-22 VITALS — BP 124/74 | HR 63 | Temp 98.1°F | Resp 16 | Ht 59.5 in | Wt 115.6 lb

## 2016-01-22 DIAGNOSIS — F329 Major depressive disorder, single episode, unspecified: Secondary | ICD-10-CM

## 2016-01-22 DIAGNOSIS — F32A Depression, unspecified: Secondary | ICD-10-CM

## 2016-01-22 MED ORDER — OMEPRAZOLE 40 MG PO CPDR
40.0000 mg | DELAYED_RELEASE_CAPSULE | Freq: Every day | ORAL | Status: DC | PRN
Start: 2016-01-22 — End: 2016-04-12

## 2016-01-22 MED ORDER — LORATADINE 10 MG PO TABS: 10.0000 mg | ORAL_TABLET | Freq: Every day | ORAL | Status: DC | PRN

## 2016-01-22 MED ORDER — MELOXICAM 7.5 MG PO TABS: 7.5000 mg | ORAL_TABLET | Freq: Every day | ORAL | Status: DC | PRN

## 2016-01-22 NOTE — Progress Notes (Signed)
Pre visit review using our clinic review tool, if applicable. No additional management support is needed unless otherwise documented below in the visit note. 

## 2016-01-22 NOTE — Assessment & Plan Note (Signed)
Currently maintained on lexapro, stable. Continue same.  Forms filled for immigration office.

## 2016-01-22 NOTE — Progress Notes (Signed)
Subjective:    Patient ID: Hailey Rasmussen, female    DOB: Dec 29, 1950, 65 y.o.   MRN: [id number]  HPI  Hailey Rasmussen is a 65 yr old female who presents today with a vietnamese interpretor and her son for follow up of her depression.  Pt has been treated for depression for approximately 20 years. She is currently on lexapro. Pt reports good mood. She is trying to apply for [place] citizenship. Pt's son is helping her pursue exemption from the testing portion of the [place] citizenship. He tells me that she completed only the 5th grade in [place] and her lack of education along with her hx of depression make it difficult for her to memorize.    Review of Systems    see HPI  Past Medical History  Diagnosis Date  . Hyperlipidemia      Social History   Social History  . Marital Status: Married    Spouse Name: N/A  . Number of Children: N/A  . Years of Education: N/A   Occupational History  . Not on file.   Social History Main Topics  . Smoking status: Never Smoker   . Smokeless tobacco: Never Used  . Alcohol Use: No  . Drug Use: Not on file  . Sexual Activity: Not on file   Other Topics Concern  . Not on file   Social History Narrative   Lives with her husband and son   She has 7 children (one daughter died at age 93) 82 children all live in [place]   Retired homemaker   She completed high school   Moved to [place] from [place] in 1997          No past surgical history on file.  Family History  Problem Relation Age of Onset  . Heart disease Father     No Known Allergies  Current Outpatient Prescriptions on File Prior to Visit  Medication Sig Dispense Refill  . escitalopram (LEXAPRO) 10 MG tablet Take 1 tablet (10 mg total) by mouth daily. 90 tablet 1  . fluticasone (FLONASE) 50 MCG/ACT nasal spray Place 2 sprays into both nostrils daily. 16 g 2  . loratadine (CLARITIN) 10 MG tablet TAKE 1 TABLET BY MOUTH EVERY DAY (Patient taking differently: TAKE 1 TABLET BY MOUTH EVERY DAY AS  NEEDED) 30 tablet 0  . meloxicam (MOBIC) 7.5 MG tablet Take 1 tablet (7.5 mg total) by mouth daily. (Patient taking differently: Take 7.5 mg by mouth daily as needed. ) 90 tablet 0  . omeprazole (PRILOSEC) 40 MG capsule Take 1 capsule (40 mg total) by mouth daily. (Patient taking differently: Take 40 mg by mouth daily as needed. ) 90 capsule 1   No current facility-administered medications on file prior to visit.    BP 124/74 mmHg  Pulse 63  Temp(Src) 98.1 F (36.7 C) (Oral)  Resp 16  Ht 4' 11.5" (1.511 m)  Wt 115 lb 9.6 oz (52.436 kg)  BMI 22.97 kg/m2  SpO2 98%  LMP 07/27/1995    Objective:   Physical Exam  Constitutional: She is oriented to person, place, and time. She appears well-developed and well-nourished.  HENT:  Head: Normocephalic and atraumatic.  Cardiovascular: Normal rate, regular rhythm and normal heart sounds.   No murmur heard. Pulmonary/Chest: Effort normal and breath sounds normal. No respiratory distress. She has no wheezes.  Neurological: She is alert and oriented to person, place, and time.  Psychiatric: She has a normal mood and affect. Her behavior is  normal. Judgment and thought content normal.          Assessment & Plan:

## 2016-01-22 NOTE — Patient Instructions (Signed)
Follow up in 6 months, sooner if problems/concerns.  

## 2016-02-11 ENCOUNTER — Ambulatory Visit (INDEPENDENT_AMBULATORY_CARE_PROVIDER_SITE_OTHER): Payer: Medicare HMO | Admitting: Family

## 2016-02-11 ENCOUNTER — Encounter: Payer: Self-pay | Admitting: Family

## 2016-02-11 VITALS — BP 110/70 | HR 69 | Temp 97.7°F | Resp 18 | Ht 59.5 in | Wt 115.8 lb

## 2016-02-11 DIAGNOSIS — H6692 Otitis media, unspecified, left ear: Secondary | ICD-10-CM | POA: Diagnosis not present

## 2016-02-11 MED ORDER — AMOXICILLIN 500 MG PO CAPS: 500.0000 mg | ORAL_CAPSULE | Freq: Three times a day (TID) | ORAL | Status: DC

## 2016-02-11 NOTE — Patient Instructions (Addendum)
Begin amoxicillin 3 times daily for your ear infection.  Call if symptoms worsen or if symptoms are not resolved in 1 week.

## 2016-02-11 NOTE — Progress Notes (Signed)
Pre visit review using our clinic review tool, if applicable. No additional management support is needed unless otherwise documented below in the visit note. 

## 2016-02-11 NOTE — Progress Notes (Addendum)
Subjective:    Patient ID: Hailey Rasmussen, female    DOB: [birth year], 65 y.o.   MRN: [id number]  HPI  Hailey Rasmussen is a 65 yr old female who presents today with c/o pain/drainage from the left ear. History is provided by the daughter. This has occurred of/on for several years.  She has hx of perforated left TM and had repair performed about 3 years ago which per pt's daughter failed. Patient reports + pain, and drainage from the left ear. She denies fever.    Review of Systems See HPI  Past Medical History  Diagnosis Date  . Hyperlipidemia      Social History   Social History  . Marital Status: Married    Spouse Name: N/A  . Number of Children: N/A  . Years of Education: N/A   Occupational History  . Not on file.   Social History Main Topics  . Smoking status: Never Smoker   . Smokeless tobacco: Never Used  . Alcohol Use: No  . Drug Use: Not on file  . Sexual Activity: Not on file   Other Topics Concern  . Not on file   Social History Narrative   Lives with her husband and son   She has 7 children (one daughter died at age 60) 18 children all live in [place]   Retired homemaker   She completed high school   Moved to [place] from [place] in 1997          No past surgical history on file.  Family History  Problem Relation Age of Onset  . Heart disease Father     No Known Allergies  Current Outpatient Prescriptions on File Prior to Visit  Medication Sig Dispense Refill  . escitalopram (LEXAPRO) 10 MG tablet Take 1 tablet (10 mg total) by mouth daily. (Patient not taking: Reported on 02/11/2016) 90 tablet 1  . fluticasone (FLONASE) 50 MCG/ACT nasal spray Place 2 sprays into both nostrils daily. (Patient not taking: Reported on 02/11/2016) 16 g 2  . loratadine (CLARITIN) 10 MG tablet Take 1 tablet (10 mg total) by mouth daily as needed. (Patient not taking: Reported on 02/11/2016) 30 tablet 0  . meloxicam (MOBIC) 7.5 MG tablet Take 1 tablet (7.5 mg total) by mouth daily as  needed. (Patient not taking: Reported on 02/11/2016) 90 tablet 0  . omeprazole (PRILOSEC) 40 MG capsule Take 1 capsule (40 mg total) by mouth daily as needed. (Patient not taking: Reported on 02/11/2016) 90 capsule 1   No current facility-administered medications on file prior to visit.    BP 110/70 mmHg  Pulse 69  Temp(Src) 97.7 F (36.5 C) (Oral)  Resp 18  Ht 4' 11.5" (1.511 m)  Wt 115 lb 12.8 oz (52.527 kg)  BMI 23.01 kg/m2  SpO2 98%  LMP 07/27/1995       Objective:   Physical Exam  Constitutional: She appears well-developed and well-nourished.  HENT:  Head: Normocephalic and atraumatic.  Right Ear: Tympanic membrane and ear canal normal.  Left Ear: Ear canal normal. Tympanic membrane is perforated and erythematous.  Cardiovascular: Normal rate, regular rhythm and normal heart sounds.   No murmur heard. Pulmonary/Chest: Effort normal and breath sounds normal. No respiratory distress. She has no wheezes.  Musculoskeletal: She exhibits no edema.  Neurological: She is alert.  Skin: Skin is warm and dry.  Psychiatric: She has a normal mood and affect. Her behavior is normal. Judgment and thought content normal.  Assessment & Plan:  Left Otitis Media- New. rx with amoxicillin. Advised pt and daughter to let me know if not improved in 1 week and I will arrange follow up with ENT.   Debbrah Alar NP

## 2016-03-03 ENCOUNTER — Telehealth: Payer: Self-pay | Admitting: Family

## 2016-03-03 DIAGNOSIS — H6690 Otitis media, unspecified, unspecified ear: Secondary | ICD-10-CM

## 2016-03-03 NOTE — Telephone Encounter (Signed)
°  Relationship to patient: Nyoka Lint Can be reached: [phone]    Reason for call: Son states the medication patient was given for her ear pain has not helped and she needs to see a specialist. Request referral to ENT

## 2016-03-04 NOTE — Telephone Encounter (Signed)
See referral

## 2016-04-12 ENCOUNTER — Encounter: Payer: Self-pay | Admitting: Family

## 2016-04-12 ENCOUNTER — Ambulatory Visit (INDEPENDENT_AMBULATORY_CARE_PROVIDER_SITE_OTHER): Payer: Medicare HMO | Admitting: Family

## 2016-04-12 DIAGNOSIS — F329 Major depressive disorder, single episode, unspecified: Secondary | ICD-10-CM

## 2016-04-12 DIAGNOSIS — K219 Gastro-esophageal reflux disease without esophagitis: Secondary | ICD-10-CM

## 2016-04-12 DIAGNOSIS — J309 Allergic rhinitis, unspecified: Secondary | ICD-10-CM

## 2016-04-12 DIAGNOSIS — Z23 Encounter for immunization: Secondary | ICD-10-CM

## 2016-04-12 DIAGNOSIS — F32A Depression, unspecified: Secondary | ICD-10-CM

## 2016-04-12 MED ORDER — OMEPRAZOLE 40 MG PO CPDR: 40.0000 mg | DELAYED_RELEASE_CAPSULE | Freq: Every day | ORAL | 1 refills | Status: DC | PRN

## 2016-04-12 MED ORDER — ESCITALOPRAM OXALATE 10 MG PO TABS: 10.0000 mg | ORAL_TABLET | Freq: Every day | ORAL | 1 refills | Status: DC

## 2016-04-12 NOTE — Assessment & Plan Note (Signed)
Discussed trial of zyrtec in place of claritin and addition of singulair to see if this helps with her cough. She reports that benadryl does not make her drowsy and is helping, therefore she wishes to continue prn benadryl.

## 2016-04-12 NOTE — Assessment & Plan Note (Signed)
Stable on lexapro, continue same.  

## 2016-04-12 NOTE — Progress Notes (Signed)
Pre visit review using our clinic review tool, if applicable. No additional management support is needed unless otherwise documented below in the visit note. 

## 2016-04-12 NOTE — Assessment & Plan Note (Signed)
Stable on PPI. Continue prilosec.

## 2016-04-12 NOTE — Progress Notes (Signed)
Subjective:    Patient ID: Hailey Rasmussen, female    DOB: 09-30-1950, 65 y.o.   MRN: [id number]  HPI  Hailey Rasmussen is a 65 yr old female who presents today for follow up. She is accompanied by her son who assists with translation. She passed her citizenship examination and is pleased about this.   1) Depression- maintained on lexapro 10mg . Son reports that her mood is good.   2)  GERD- maintained on prilosec. She denies heartburn symptoms.    3) Allergic rhinitis.   on claritin and flonase.  Flonase did not help her cough. Reports some chronic cough.  Cough seems to be improved by benadryl but not by claritin.  She would like to continue benadryl prn.    Pt would like flu shot today.  Review of Systems See HPI  Past Medical History:  Diagnosis Date  . Hyperlipidemia      Social History   Social History  . Marital status: Married    Spouse name: N/A  . Number of children: N/A  . Years of education: N/A   Occupational History  . Not on file.   Social History Main Topics  . Smoking status: Never Smoker  . Smokeless tobacco: Never Used  . Alcohol use No  . Drug use: Unknown  . Sexual activity: Not on file   Other Topics Concern  . Not on file   Social History Narrative   Lives with her husband and son   She has 7 children (one daughter died at age 1) 32 children all live in [place]   Retired homemaker   She completed high school   Moved to [place] from [place] in 1997          No past surgical history on file.  Family History  Problem Relation Age of Onset  . Heart disease Father     No Known Allergies  Current Outpatient Prescriptions on File Prior to Visit  Medication Sig Dispense Refill  . escitalopram (LEXAPRO) 10 MG tablet Take 1 tablet (10 mg total) by mouth daily. 90 tablet 1  . fluticasone (FLONASE) 50 MCG/ACT nasal spray Place 2 sprays into both nostrils daily. 16 g 2  . loratadine (CLARITIN) 10 MG tablet Take 1 tablet (10 mg total) by mouth daily as  needed. 30 tablet 0  . meloxicam (MOBIC) 7.5 MG tablet Take 1 tablet (7.5 mg total) by mouth daily as needed. 90 tablet 0  . omeprazole (PRILOSEC) 40 MG capsule Take 1 capsule (40 mg total) by mouth daily as needed. 90 capsule 1   No current facility-administered medications on file prior to visit.     BP (!) 146/71 (BP Location: Right Arm, Cuff Size: Normal)   Pulse 61   Temp 98.5 F (36.9 C) (Oral)   Resp 18   Ht 4' 11.5" (1.511 m)   Wt 118 lb 3.2 oz (53.6 kg)   LMP 07/27/1995   SpO2 99% Comment: room air  BMI 23.47 kg/m       Objective:   Physical Exam  Constitutional: She is oriented to person, place, and time. She appears well-developed and well-nourished.  HENT:  Head: Normocephalic and atraumatic.  Cardiovascular: Normal rate, regular rhythm and normal heart sounds.   No murmur heard. Pulmonary/Chest: Effort normal and breath sounds normal. No respiratory distress. She has no wheezes.  Neurological: She is alert and oriented to person, place, and time.  Skin: Skin is warm and dry.  Psychiatric: She has  a normal mood and affect. Her behavior is normal. Judgment and thought content normal.          Assessment & Plan:  Flu shot today

## 2016-05-14 ENCOUNTER — Ambulatory Visit: Payer: Medicare HMO | Admitting: *Deleted

## 2016-07-13 ENCOUNTER — Encounter: Payer: Self-pay | Admitting: Family

## 2016-07-13 ENCOUNTER — Ambulatory Visit (INDEPENDENT_AMBULATORY_CARE_PROVIDER_SITE_OTHER): Payer: Medicare HMO | Admitting: Family

## 2016-07-13 ENCOUNTER — Encounter (HOSPITAL_BASED_OUTPATIENT_CLINIC_OR_DEPARTMENT_OTHER): Payer: Self-pay

## 2016-07-13 ENCOUNTER — Ambulatory Visit (HOSPITAL_BASED_OUTPATIENT_CLINIC_OR_DEPARTMENT_OTHER)
Admission: RE | Admit: 2016-07-13 | Discharge: 2016-07-13 | Disposition: A | Discharge status: Home / Self Care | Payer: Medicare HMO | Source: Ambulatory Visit | Attending: Family | Admitting: Family

## 2016-07-13 ENCOUNTER — Other Ambulatory Visit (HOSPITAL_COMMUNITY)
Admission: RE | Admit: 2016-07-13 | Discharge: 2016-07-13 | Disposition: A | Discharge status: Home / Self Care | Payer: Medicare HMO | Source: Ambulatory Visit | Attending: Family | Admitting: Family

## 2016-07-13 VITALS — BP 137/68 | HR 60 | Temp 98.4°F | Ht 58.5 in | Wt 120.2 lb

## 2016-07-13 DIAGNOSIS — Z1211 Encounter for screening for malignant neoplasm of colon: Secondary | ICD-10-CM

## 2016-07-13 DIAGNOSIS — Z1239 Encounter for other screening for malignant neoplasm of breast: Secondary | ICD-10-CM

## 2016-07-13 DIAGNOSIS — Z780 Asymptomatic menopausal state: Secondary | ICD-10-CM | POA: Insufficient documentation

## 2016-07-13 DIAGNOSIS — E2839 Other primary ovarian failure: Secondary | ICD-10-CM | POA: Insufficient documentation

## 2016-07-13 DIAGNOSIS — Z01419 Encounter for gynecological examination (general) (routine) without abnormal findings: Secondary | ICD-10-CM | POA: Diagnosis not present

## 2016-07-13 DIAGNOSIS — Z13820 Encounter for screening for osteoporosis: Secondary | ICD-10-CM | POA: Diagnosis present

## 2016-07-13 DIAGNOSIS — Z1231 Encounter for screening mammogram for malignant neoplasm of breast: Secondary | ICD-10-CM | POA: Insufficient documentation

## 2016-07-13 DIAGNOSIS — R739 Hyperglycemia, unspecified: Secondary | ICD-10-CM | POA: Diagnosis not present

## 2016-07-13 DIAGNOSIS — Z0000 Encounter for general adult medical examination without abnormal findings: Secondary | ICD-10-CM

## 2016-07-13 DIAGNOSIS — Z1151 Encounter for screening for human papillomavirus (HPV): Secondary | ICD-10-CM | POA: Insufficient documentation

## 2016-07-13 DIAGNOSIS — E785 Hyperlipidemia, unspecified: Secondary | ICD-10-CM | POA: Diagnosis not present

## 2016-07-13 DIAGNOSIS — M810 Age-related osteoporosis without current pathological fracture: Secondary | ICD-10-CM | POA: Diagnosis not present

## 2016-07-13 LAB — COMPREHENSIVE METABOLIC PANEL
ALK PHOS: 54 U/L (ref 39–117)
ALT: 26 U/L (ref 0–35)
AST: 27 U/L (ref 0–37)
Albumin: 4.7 g/dL (ref 3.5–5.2)
BUN: 13 mg/dL (ref 6–23)
CO2: 30 mEq/L (ref 19–32)
Calcium: 9.7 mg/dL (ref 8.4–10.5)
Chloride: 102 mEq/L (ref 96–112)
Creatinine, Ser: 0.62 mg/dL (ref 0.40–1.20)
GFR: 101.89 mL/min (ref >60.00)
Glucose, Bld: 102 mg/dL — ABNORMAL HIGH (ref 70–99)
POTASSIUM: 3.8 meq/L (ref 3.5–5.1)
SODIUM: 139 meq/L (ref 135–145)
TOTAL PROTEIN: 7.6 g/dL (ref 6.0–8.3)
Total Bilirubin: 0.6 mg/dL (ref 0.2–1.2)

## 2016-07-13 LAB — LIPID PANEL
CHOLESTEROL: 209 mg/dL — AB (ref 0–200)
HDL: 50.3 mg/dL (ref >39.00)
NonHDL: 158.61
Total CHOL/HDL Ratio: 4
Triglycerides: 272 mg/dL — ABNORMAL HIGH (ref 0.0–149.0)
VLDL: 54.4 mg/dL — AB (ref 0.0–40.0)

## 2016-07-13 LAB — HEMOGLOBIN A1C: Hgb A1c MFr Bld: 6.1 % (ref 4.6–6.5)

## 2016-07-13 LAB — LDL CHOLESTEROL, DIRECT: LDL DIRECT: 113 mg/dL

## 2016-07-13 NOTE — Patient Instructions (Addendum)
Please complete lab work prior to leaving. You will be contacted about your referral for colonoscopy and bone density. Please schedule an eye exam. Schedule mammogram on the first floor.

## 2016-07-13 NOTE — Progress Notes (Signed)
Pre visit review using our clinic review tool, if applicable. No additional management support is needed unless otherwise documented below in the visit note. 

## 2016-07-13 NOTE — Progress Notes (Signed)
Subjective:    Patient ID: Hailey Rasmussen, female    DOB: 1950-08-16, 65 y.o.   MRN: [id number]  HPI  Patient presents today for complete physical.  Immunizations: up to date Diet: healthy Exercise: exercising more in the summer time (gardens/yard work)  Colonoscopy: due Dexa: due Pap Smear: due Mammogram: due  Wt Readings from Last 3 Encounters:  07/13/16 120 lb 3.2 oz (54.5 kg)  04/12/16 118 lb 3.2 oz (53.6 kg)  02/11/16 115 lb 12.8 oz (52.5 kg)   Son is present today and assists in translation   Review of Systems  Constitutional: Negative for unexpected weight change.  HENT:       Some hearing issues due to TM perforation  Eyes:       Some issues with reading  Respiratory: Negative for shortness of breath.   Cardiovascular: Negative for chest pain.  Gastrointestinal: Negative for constipation and diarrhea.  Genitourinary: Negative for dysuria and hematuria.  Musculoskeletal: Negative for arthralgias.  Neurological: Negative for headaches.  Hematological: Negative for adenopathy.  Psychiatric/Behavioral:       Reports mood is good.    Past Medical History:  Diagnosis Date  . Hyperlipidemia      Social History   Social History  . Marital status: Married    Spouse name: N/A  . Number of children: N/A  . Years of education: N/A   Occupational History  . Not on file.   Social History Main Topics  . Smoking status: Never Smoker  . Smokeless tobacco: Never Used  . Alcohol use No  . Drug use: Unknown  . Sexual activity: Not on file   Other Topics Concern  . Not on file   Social History Narrative   Lives with her husband and son   She has 7 children (one daughter died at age 33) 2 children all live in [place]   Retired homemaker   She completed high school   Moved to [place] from [place] in 1997          No past surgical history on file.  Family History  Problem Relation Age of Onset  . Heart disease Father     No Known Allergies  Current  Outpatient Prescriptions on File Prior to Visit  Medication Sig Dispense Refill  . escitalopram (LEXAPRO) 10 MG tablet Take 1 tablet (10 mg total) by mouth daily. 90 tablet 1  . fluticasone (FLONASE) 50 MCG/ACT nasal spray Place 2 sprays into both nostrils daily. 16 g 2  . meloxicam (MOBIC) 7.5 MG tablet Take 1 tablet (7.5 mg total) by mouth daily as needed. 90 tablet 0  . omeprazole (PRILOSEC) 40 MG capsule Take 1 capsule (40 mg total) by mouth daily as needed. 90 capsule 1   No current facility-administered medications on file prior to visit.     BP 137/68   Pulse 60   Temp 98.4 F (36.9 C) (Oral)   Ht 4' 10.5" (1.486 m)   Wt 120 lb 3.2 oz (54.5 kg)   LMP 07/27/1995   SpO2 98%   BMI 24.69 kg/m       Objective:   Physical Exam  Physical Exam  Constitutional: She is oriented to person, place, and time. She appears well-developed and well-nourished. No distress.  HENT:  Head: Normocephalic and atraumatic.  Right Ear: Tympanic membrane and ear canal normal.  Left Ear: Tympanic membrane and ear canal normal.  Mouth/Throat: Oropharynx is clear and moist.  Eyes: Pupils are equal, round, and  reactive to light. No scleral icterus.  Neck: Normal range of motion. No thyromegaly present.  Cardiovascular: Normal rate and regular rhythm.   No murmur heard. Pulmonary/Chest: Effort normal and breath sounds normal. No respiratory distress. He has no wheezes. She has no rales. She exhibits no tenderness.  Abdominal: Soft. Bowel sounds are normal. She exhibits no distension and no mass. There is no tenderness. There is no rebound and no guarding.  Musculoskeletal: She exhibits no edema.  Lymphadenopathy:    She has no cervical adenopathy.  Neurological: She is alert and oriented to person, place, and time. She has normal patellar reflexes. She exhibits normal muscle tone. Coordination normal.  Skin: Skin is warm and dry.  Psychiatric: She has a normal mood and affect. Her behavior is  normal. Judgment and thought content normal.  Breasts: Examined lying Right: Without masses, retractions, discharge or axillary adenopathy.  Left: Without masses, retractions, discharge or axillary adenopathy.  Inguinal/mons: Normal without inguinal adenopathy  External genitalia: Normal  BUS/Urethra/Skene's glands: Normal  Bladder: Normal  Vagina: Normal  Cervix: Normal  Uterus: normal in size, shape and contour. Midline and mobile  Adnexa/parametria:  Rt: Without masses or tenderness.  Lt: Without masses or tenderness.  Anus and perineum: Normal            Assessment & Plan:   Preventative Care- continue healthy diet, exercise.  Obtain routine lab work. Refer for dexa.  Pap performed today. Refer for colonoscopy.     Assessment & Plan:

## 2016-07-14 LAB — CYTOLOGY - PAP
Diagnosis: NEGATIVE
HPV: NOT DETECTED

## 2016-07-15 ENCOUNTER — Encounter: Payer: Self-pay | Admitting: Family

## 2016-07-16 ENCOUNTER — Encounter: Payer: Self-pay | Admitting: Family

## 2016-07-16 ENCOUNTER — Telehealth: Payer: Self-pay | Admitting: Family

## 2016-07-16 ENCOUNTER — Other Ambulatory Visit (INDEPENDENT_AMBULATORY_CARE_PROVIDER_SITE_OTHER): Payer: Medicare HMO

## 2016-07-16 DIAGNOSIS — E559 Vitamin D deficiency, unspecified: Secondary | ICD-10-CM | POA: Diagnosis not present

## 2016-07-16 DIAGNOSIS — M810 Age-related osteoporosis without current pathological fracture: Secondary | ICD-10-CM

## 2016-07-16 HISTORY — DX: Age-related osteoporosis without current pathological fracture: M81.0

## 2016-07-16 MED ORDER — ALENDRONATE SODIUM 70 MG PO TABS: 70.0000 mg | ORAL_TABLET | ORAL | 11 refills | Status: DC

## 2016-07-16 MED ORDER — CALCIUM CARBONATE-VITAMIN D 600-400 MG-UNIT PO TABS: 1.0000 | ORAL_TABLET | Freq: Two times a day (BID) | ORAL | Status: AC

## 2016-07-16 NOTE — Telephone Encounter (Signed)
Please contact son and let him know that her bone density shows osteoporosis (bone thinning). I would like her to complete a vit D (can it be added on to her labs?) dx osteoporosis.  Also add caltrate 600mg  +D bid, and ensure regular weight bearing exercise.

## 2016-07-20 LAB — VITAMIN D 25 HYDROXY (VIT D DEFICIENCY, FRACTURES): VITD: 53 ng/mL (ref 30.00–100.00)

## 2016-07-21 ENCOUNTER — Other Ambulatory Visit: Payer: Self-pay | Admitting: *Deleted

## 2016-07-21 NOTE — Telephone Encounter (Signed)
Son notified and add on sent to lab for vitamin d.

## 2016-08-13 ENCOUNTER — Encounter: Payer: Self-pay | Admitting: Family

## 2016-09-23 IMAGING — DX DG CHEST 2V
2 series · 2 of 2 positions shown · non-contrast
Comparison: PA and lateral chest x-rays July 30, 2014 and
May 24, 2008.

CLINICAL DATA: One month of increased cough, nonsmoker, history of
allergic rhinitis and chronic cough.

EXAM:
CHEST  2 VIEW

[chest pa]
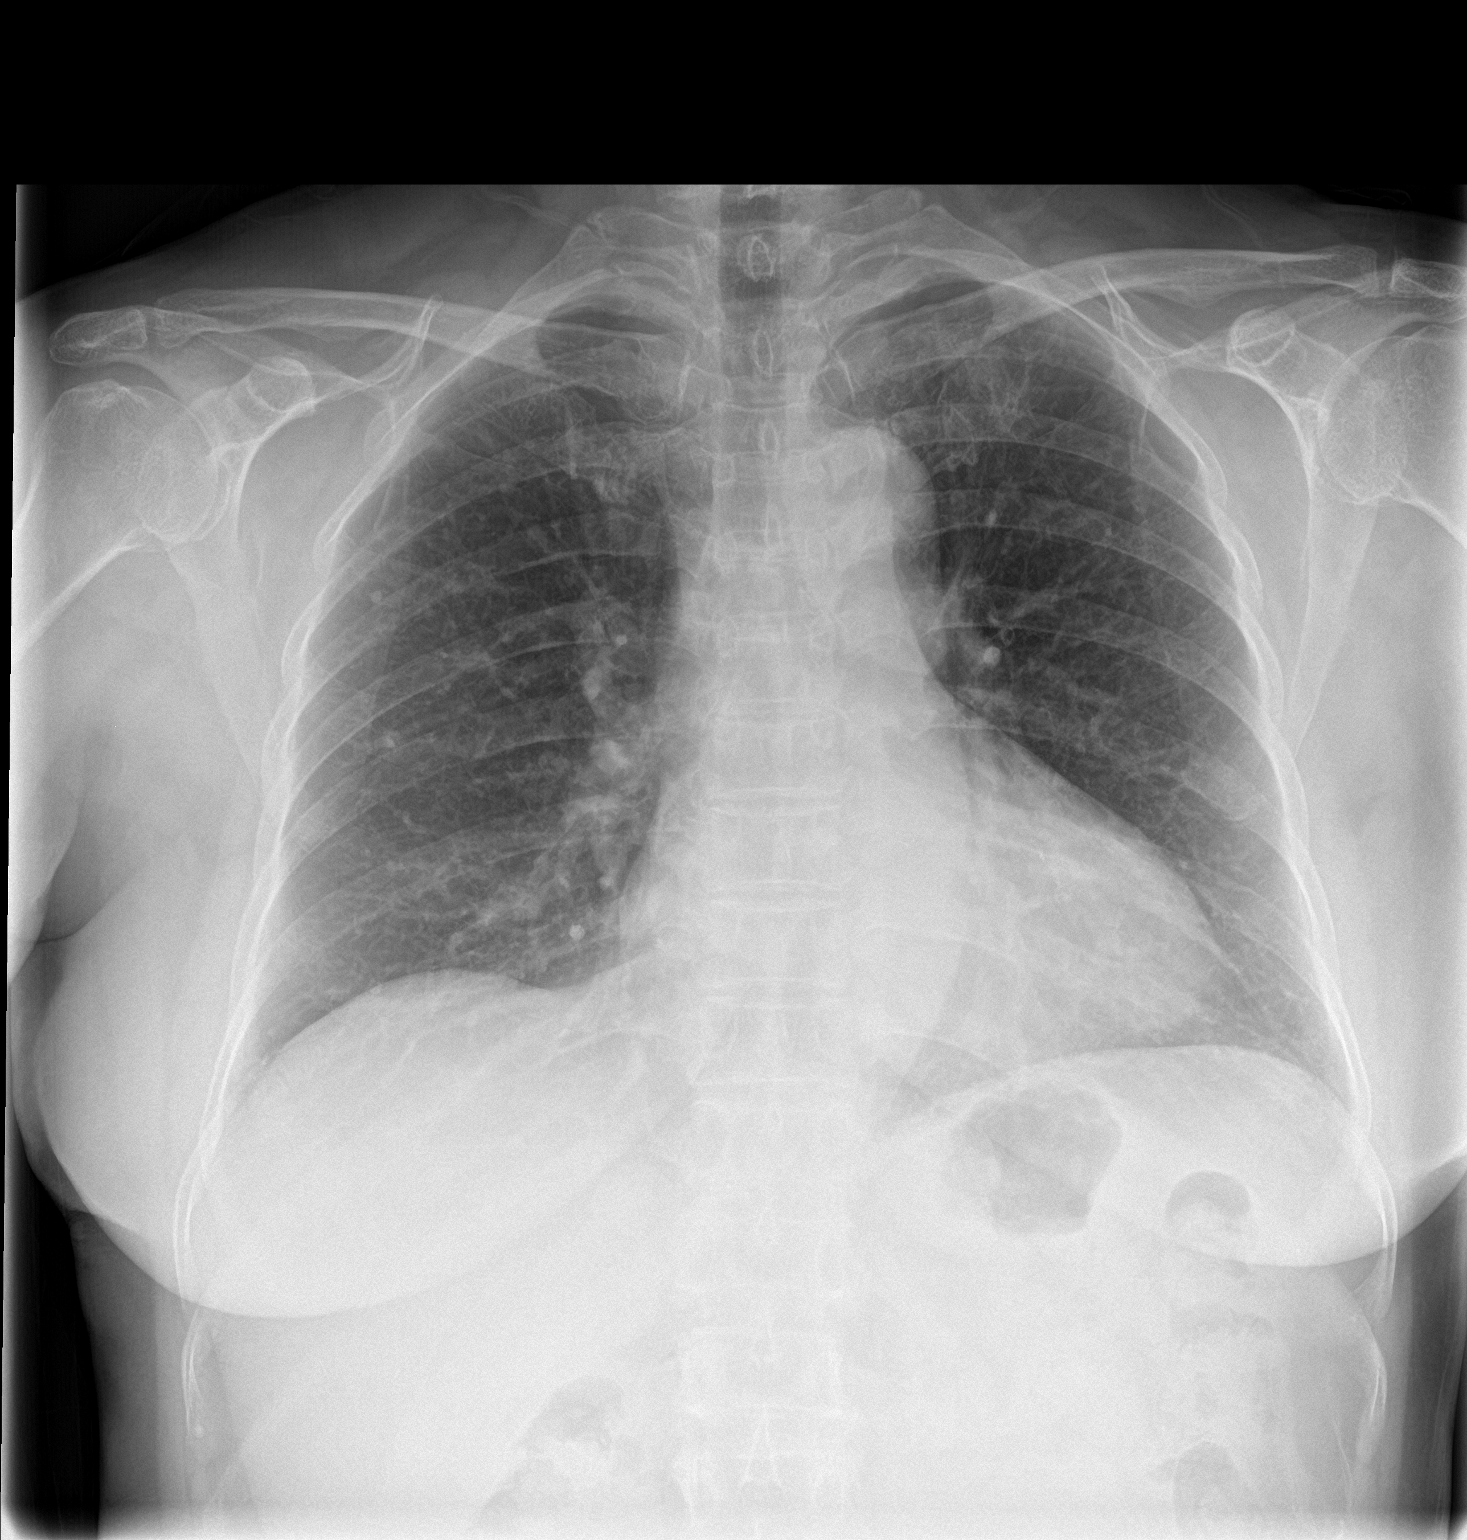

[chest lat]
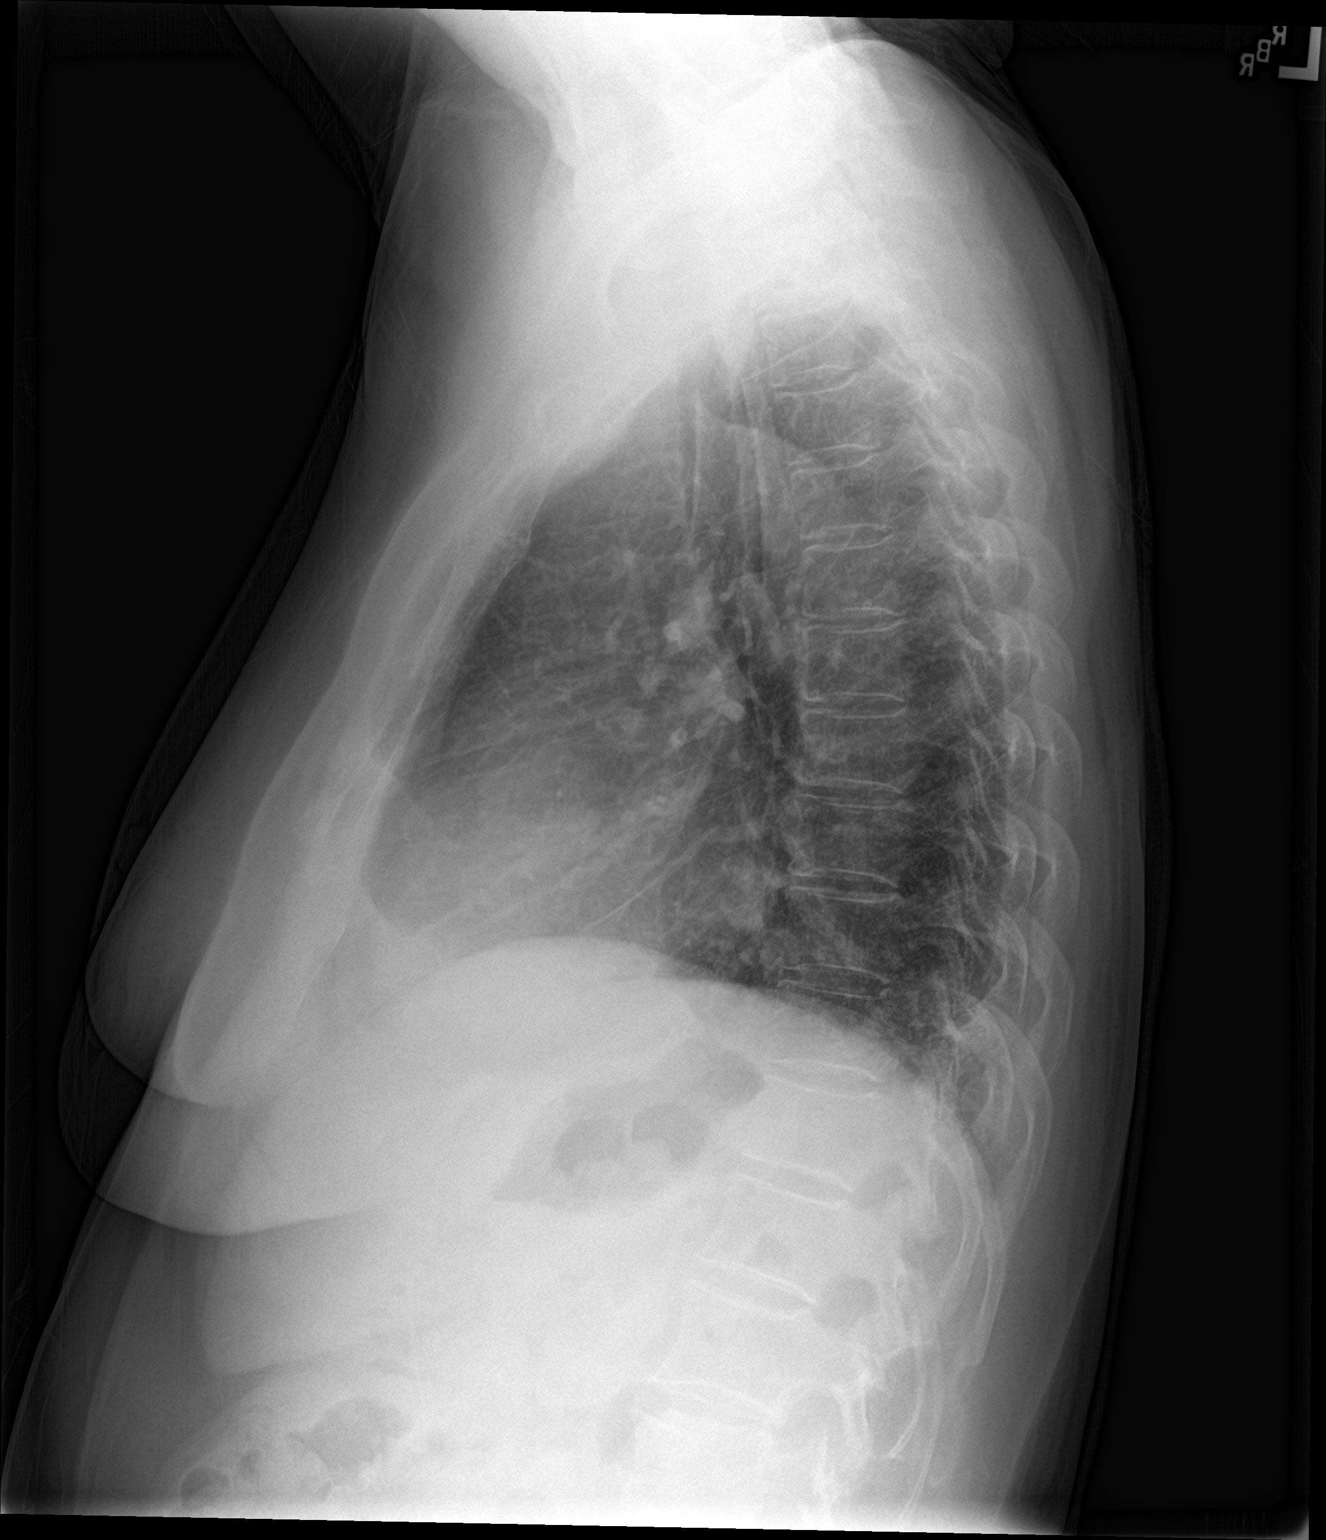

[2 of 2 positions shown; findings below may reference images not displayed]

FINDINGS: The lungs are adequately inflated. There is are stable 2-3 mm
diameter nodules in the right lung which appear faintly calcified.
There are coarse lung markings in the retrocardiac region likely on
the left. There is no pleural effusion or pneumothorax. The heart is
top-normal in size. The pulmonary vascularity is not engorged. The
trachea is midline. There is tortuosity of the descending thoracic
aorta. The observed bony thorax exhibits no acute abnormality.
IMPRESSION: There is atelectasis or pneumonia likely in the anterior aspect of
the left lower lobe in the retrocardiac region.

Followup PA and lateral chest X-ray is recommended in 3-4 weeks
following trial of antibiotic therapy to ensure resolution and
exclude underlying malignancy.

## 2016-11-26 DIAGNOSIS — J0100 Acute maxillary sinusitis, unspecified: Secondary | ICD-10-CM | POA: Diagnosis not present

## 2016-11-26 DIAGNOSIS — J40 Bronchitis, not specified as acute or chronic: Secondary | ICD-10-CM | POA: Diagnosis not present

## 2016-12-09 DIAGNOSIS — H43399 Other vitreous opacities, unspecified eye: Secondary | ICD-10-CM | POA: Diagnosis not present

## 2016-12-09 DIAGNOSIS — H6501 Acute serous otitis media, right ear: Secondary | ICD-10-CM | POA: Diagnosis not present

## 2016-12-09 DIAGNOSIS — J069 Acute upper respiratory infection, unspecified: Secondary | ICD-10-CM | POA: Diagnosis not present

## 2016-12-09 DIAGNOSIS — H66002 Acute suppurative otitis media without spontaneous rupture of ear drum, left ear: Secondary | ICD-10-CM | POA: Diagnosis not present

## 2017-01-12 ENCOUNTER — Ambulatory Visit: Payer: Medicare HMO | Admitting: Family

## 2017-02-14 ENCOUNTER — Ambulatory Visit (INDEPENDENT_AMBULATORY_CARE_PROVIDER_SITE_OTHER): Payer: Medicare HMO | Admitting: Medical

## 2017-02-14 ENCOUNTER — Telehealth: Payer: Self-pay | Admitting: Family

## 2017-02-14 ENCOUNTER — Ambulatory Visit (HOSPITAL_BASED_OUTPATIENT_CLINIC_OR_DEPARTMENT_OTHER)
Admission: RE | Admit: 2017-02-14 | Discharge: 2017-02-14 | Disposition: A | Discharge status: Home / Self Care | Payer: Medicare HMO | Source: Ambulatory Visit | Attending: Medical | Admitting: Medical

## 2017-02-14 ENCOUNTER — Encounter: Payer: Self-pay | Admitting: Medical

## 2017-02-14 VITALS — BP 140/67 | HR 64 | Temp 98.4°F | Resp 14 | Ht <= 58 in | Wt 117.8 lb

## 2017-02-14 DIAGNOSIS — R700 Elevated erythrocyte sedimentation rate: Secondary | ICD-10-CM

## 2017-02-14 DIAGNOSIS — R5383 Other fatigue: Secondary | ICD-10-CM

## 2017-02-14 DIAGNOSIS — R509 Fever, unspecified: Secondary | ICD-10-CM

## 2017-02-14 DIAGNOSIS — R52 Pain, unspecified: Secondary | ICD-10-CM

## 2017-02-14 DIAGNOSIS — R05 Cough: Secondary | ICD-10-CM | POA: Diagnosis not present

## 2017-02-14 DIAGNOSIS — J029 Acute pharyngitis, unspecified: Secondary | ICD-10-CM

## 2017-02-14 MED ORDER — AMOXICILLIN 875 MG PO TABS: 875.0000 mg | ORAL_TABLET | Freq: Two times a day (BID) | ORAL | 0 refills | Status: DC

## 2017-02-14 NOTE — Progress Notes (Signed)
Subjective:    Patient ID: Hailey Rasmussen, female    DOB: [birth year], 66 y.o.   MRN: [id number]  HPI  Pt in feeling low grade fever on Thursday on Friday. Pt states some body aches and some weakness.   Sunday early am she was tired and did not want to get out of bed.  2 days ago had moderate to severe st hurt for her to swallow. Still has pain but much less.  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   Pt had some st a couple. 25 yo grand child. But no know strep contact.  No known tick bites.  Pt had occasional cough but long history of cough in the past. Prior meds for cough did not help. Pt son states negative work up for cough in past but she does have history of pneumonia in past year. But not recently.  Pt had no pain on urination. No hx of uti per patient.     Review of Systems  Constitutional: Positive for fatigue and fever. Negative for chills.  HENT: Positive for sore throat. Negative for congestion, ear pain, mouth sores, postnasal drip, rhinorrhea, sinus pain, tinnitus and trouble swallowing.        Left ear hx of tm perforation. Pt saw ENT in the past.  Eyes: Negative for pain and discharge.  Respiratory: Negative for cough, chest tightness, shortness of breath and stridor.   Cardiovascular: Negative for chest pain and palpitations.  Gastrointestinal: Negative for abdominal pain, blood in stool, constipation, diarrhea and nausea.  Genitourinary: Negative for difficulty urinating, dysuria, frequency, hematuria, pelvic pain and urgency.  Musculoskeletal: Positive for myalgias. Negative for arthralgias, gait problem, neck pain and neck stiffness.       Achiness to joints.  Skin: Negative for rash.  Neurological: Positive for weakness. Negative for dizziness, seizures, numbness and headaches.  Hematological: Negative for adenopathy. Does not bruise/bleed easily.  Psychiatric/Behavioral: Negative for behavioral problems, decreased concentration,  dysphoric mood, self-injury and suicidal ideas.    Past Medical History:  Diagnosis Date  . Hyperlipidemia   . Osteoporosis 07/16/2016     Social History   Social History  . Marital status: Married    Spouse name: N/A  . Number of children: N/A  . Years of education: N/A   Occupational History  . Not on file.   Social History Main Topics  . Smoking status: Never Smoker  . Smokeless tobacco: Never Used  . Alcohol use No  . Drug use: Unknown  . Sexual activity: Not on file   Other Topics Concern  . Not on file   Social History Narrative   Lives with her husband and son   She has 7 children (one daughter died at age 35) 9 children all live in [place]   Retired homemaker   She completed high school   Moved to [place] from [place] in 1997          No past surgical history on file.  Family History  Problem Relation Age of Onset  . Heart disease Father     No Known Allergies  Current Outpatient Prescriptions on File Prior to Visit  Medication Sig Dispense Refill  . alendronate (FOSAMAX) 70 MG tablet Take 1 tablet (70 mg total) by mouth every 7 (seven) days. Take with a full glass of water on an empty stomach. 4 tablet 11  . Calcium Carbonate-Vitamin D (CALTRATE 600+D) 600-400 MG-UNIT tablet Take 1 tablet by  mouth 2 (two) times daily.    Marland Kitchen escitalopram (LEXAPRO) 10 MG tablet Take 1 tablet (10 mg total) by mouth daily. 90 tablet 1  . fluticasone (FLONASE) 50 MCG/ACT nasal spray Place 2 sprays into both nostrils daily. 16 g 2  . meloxicam (MOBIC) 7.5 MG tablet Take 1 tablet (7.5 mg total) by mouth daily as needed. 90 tablet 0  . omeprazole (PRILOSEC) 40 MG capsule Take 1 capsule (40 mg total) by mouth daily as needed. 90 capsule 1   No current facility-administered medications on file prior to visit.     BP 140/67   Pulse 64   Temp 98.4 F (36.9 C) (Oral)   Resp 14   Ht 4\' 10"  (1.473 m)   Wt 117 lb 12.8 oz (53.4 kg)   LMP 07/27/1995   SpO2 96%   BMI 24.62 kg/m        Objective:   Physical Exam  General  Mental Status - Alert. General Appearance - Well groomed. Not in acute distress.  Skin Rashes- No Rashes.  HEENT Head- Normal. Ear Auditory Canal - Left- Normal. Right - Normal.Tympanic Membrane- Left- central redness/dullness to tm. No perforation seen Right- Normal. Eye Sclera/Conjunctiva- Left- Normal. Right- Normal. Nose & Sinuses Nasal Mucosa- Left-  Not Boggy and Congested. Right-  Not  Boggy and  Congested.Bilateral no  maxillary and  No frontal sinus pressure. Mouth & Throat Lips: Upper Lip- Normal: no dryness, cracking, pallor, cyanosis, or vesicular eruption. Lower Lip-Normal: no dryness, cracking, pallor, cyanosis or vesicular eruption. Buccal Mucosa- Bilateral- No Aphthous ulcers. Oropharynx- No Discharge or Erythema. Tonsils: Characteristics- Bilateral- moderateErythema . Size/Enlargement- Bilateral- No enlargement. Discharge- bilateral-None.  Neck Neck- Supple. No Masses. Mild enlarged submandibular lymph nodes but not tender.   Chest and Lung Exam Auscultation: Breath Sounds:-Clear even and unlabored.  Cardiovascular Auscultation:Rythm- Regular, rate and rhythm. Murmurs & Other Heart Sounds:Ausculatation of the heart reveal- No Murmurs.  Lymphatic Head & Neck General Head & Neck Lymphatics: Bilateral: Description- see neck exam. Otherwise no lymph nodes felt.  Abdomen- soft, non-tender, non-distended, +bs, no rebound or guarding.  Back- no cva tenderness    Neuro- CN III-XII grossly intact.No deficits.       Assessment & Plan:  Recent sore throat  With associated symptoms causes concern for strep despite negative rapid test so will amoxicillin antibiotic. Also history of left ear infection and currently your left tympanic membrane looks red.  Will write amoxicillin for both throat and ear. Send out throat culture pending.  Important to note diffuse body aches can occur with strep and your throat does look  bright red on exam.    With your described severe fatigue and on review history of pneumonia, I do think labs would be a good idea as well as cxr.  Follow up in 7 days or as needed  Considered getting tick bite studies but absense of tick bit history. Also considered urine study/culture but no associated uti type symptoms. Will follow work up today. Expand work up if symptoms persist.  Architect, [place], [place]

## 2017-02-14 NOTE — Telephone Encounter (Signed)
Son Nyoka Lint called our office wanting to make an appt for mother Pegg) son stated mother not feeling both legs since yesterday. Pt was transferred to triage nurse.

## 2017-02-14 NOTE — Patient Instructions (Addendum)
Recent sore throat with associated symptoms  causes concern for strep despite negative rapid test so  will amoxicillin antibiotic. Also history  of left ear infection and currently your left tympanic membrane looks red.  Will write amoxicillin for both throat and ear. Send out throat culture pending.  Important to note diffuse body aches can occur with strep and your throat does look bright red on exam.   With your described severe fatigue and on review history of pneumonia, I do think labs would be a good idea as well as cxr.  Follow up in 7 days or as needed

## 2017-02-14 NOTE — Telephone Encounter (Signed)
[facility] - Client [facility]  Patient Name: Hailey Rasmussen  DOB: [birth year]    Initial Comment Callr states mother can't stand up or walk, she hasn't had any feeling in her legs   Nurse Assessment  Nurse: Hervey Ard, RN, [place] Date/Time (Eastern Time): 02/14/2017 12:54:54 PM  Confirm and document reason for call. If symptomatic, describe symptoms. ---Caller states that his Mom had an episode yesterday when her knee and arm and elbow became week. She is better than yesterday but arm. elbow and knee are still week on both sides. She feels a lot of pain. Think she is running a fever.  Does the patient have any new or worsening symptoms? ---Yes  Will a triage be completed? ---Yes  Related visit to physician within the last 2 weeks? ---No  Does the PT have any chronic conditions? (i.e. diabetes, asthma, etc.) ---Yes  List chronic conditions. ---Depression, stomach issues  Is this a behavioral health or substance abuse call? ---No     Guidelines    Guideline Title Affirmed Question Affirmed Notes  Weakness (Generalized) and Fatigue [1] MODERATE weakness (i.e., interferes with work, school, normal activities) AND [2] cause unknown (Exceptions: weakness with acute minor illness, or weakness from poor fluid intake)    Final Disposition User   See Physician within 4 Hours (or PCP triage) Hervey Ard, RN, Jewel    Referrals  REFERRED TO PCP OFFICE   Disagree/Comply: Comply

## 2017-02-15 LAB — COMPREHENSIVE METABOLIC PANEL
ALT: 29 U/L (ref 0–35)
AST: 24 U/L (ref 0–37)
Albumin: 4.2 g/dL (ref 3.5–5.2)
Alkaline Phosphatase: 66 U/L (ref 39–117)
BUN: 14 mg/dL (ref 6–23)
CALCIUM: 9.3 mg/dL (ref 8.4–10.5)
CHLORIDE: 105 meq/L (ref 96–112)
CO2: 27 meq/L (ref 19–32)
CREATININE: 0.75 mg/dL (ref 0.40–1.20)
GFR: 81.65 mL/min (ref >60.00)
Glucose, Bld: 98 mg/dL (ref 70–99)
Potassium: 4.5 mEq/L (ref 3.5–5.1)
SODIUM: 140 meq/L (ref 135–145)
Total Bilirubin: 0.4 mg/dL (ref 0.2–1.2)
Total Protein: 7.6 g/dL (ref 6.0–8.3)

## 2017-02-15 LAB — CBC WITH DIFFERENTIAL/PLATELET
BASOS PCT: 1.2 % (ref 0.0–3.0)
Basophils Absolute: 0.1 10*3/uL (ref 0.0–0.1)
EOS ABS: 0.3 10*3/uL (ref 0.0–0.7)
Eosinophils Relative: 2.6 % (ref 0.0–5.0)
HCT: 40.5 % (ref 36.0–46.0)
Hemoglobin: 13.1 g/dL (ref 12.0–15.0)
LYMPHS ABS: 5 10*3/uL — AB (ref 0.7–4.0)
Lymphocytes Relative: 49.3 % — ABNORMAL HIGH (ref 12.0–46.0)
MCHC: 32.5 g/dL (ref 30.0–36.0)
MCV: 93 fl (ref 78.0–100.0)
MONO ABS: 0.8 10*3/uL (ref 0.1–1.0)
Monocytes Relative: 8.3 % (ref 3.0–12.0)
NEUTROS ABS: 3.9 10*3/uL (ref 1.4–7.7)
Neutrophils Relative %: 38.6 % — ABNORMAL LOW (ref 43.0–77.0)
PLATELETS: 345 10*3/uL (ref 150.0–400.0)
RBC: 4.35 Mil/uL (ref 3.87–5.11)
RDW: 13.4 % (ref 11.5–15.5)
WBC: 10.1 10*3/uL (ref 4.0–10.5)

## 2017-02-16 LAB — CULTURE, GROUP A STREP

## 2017-03-02 ENCOUNTER — Encounter: Payer: Self-pay | Admitting: Family Medicine

## 2017-03-02 ENCOUNTER — Ambulatory Visit (INDEPENDENT_AMBULATORY_CARE_PROVIDER_SITE_OTHER): Payer: Medicare HMO | Admitting: Family Medicine

## 2017-03-02 VITALS — BP 126/78 | HR 71 | Temp 98.1°F | Ht <= 58 in | Wt 116.8 lb

## 2017-03-02 DIAGNOSIS — M791 Myalgia, unspecified site: Secondary | ICD-10-CM

## 2017-03-02 DIAGNOSIS — R5383 Other fatigue: Secondary | ICD-10-CM

## 2017-03-02 LAB — TSH: TSH: 1.19 u[IU]/mL (ref 0.35–4.50)

## 2017-03-02 LAB — COMPREHENSIVE METABOLIC PANEL
ALK PHOS: 72 U/L (ref 39–117)
ALT: 13 U/L (ref 0–35)
AST: 20 U/L (ref 0–37)
Albumin: 4.4 g/dL (ref 3.5–5.2)
BUN: 14 mg/dL (ref 6–23)
CHLORIDE: 101 meq/L (ref 96–112)
CO2: 31 meq/L (ref 19–32)
Calcium: 10 mg/dL (ref 8.4–10.5)
Creatinine, Ser: 0.7 mg/dL (ref 0.40–1.20)
GFR: 88.41 mL/min (ref >60.00)
GLUCOSE: 116 mg/dL — AB (ref 70–99)
POTASSIUM: 4.3 meq/L (ref 3.5–5.1)
SODIUM: 137 meq/L (ref 135–145)
Total Bilirubin: 0.4 mg/dL (ref 0.2–1.2)
Total Protein: 8.6 g/dL — ABNORMAL HIGH (ref 6.0–8.3)

## 2017-03-02 LAB — CBC
HEMATOCRIT: 40 % (ref 36.0–46.0)
Hemoglobin: 13 g/dL (ref 12.0–15.0)
MCHC: 32.6 g/dL (ref 30.0–36.0)
MCV: 92.5 fl (ref 78.0–100.0)
Platelets: 422 10*3/uL — ABNORMAL HIGH (ref 150.0–400.0)
RBC: 4.32 Mil/uL (ref 3.87–5.11)
RDW: 13 % (ref 11.5–15.5)
WBC: 7.9 10*3/uL (ref 4.0–10.5)

## 2017-03-02 LAB — POC URINALSYSI DIPSTICK (AUTOMATED)
BILIRUBIN UA: NEGATIVE
Blood, UA: NEGATIVE
GLUCOSE UA: NEGATIVE
Ketones, UA: NEGATIVE
LEUKOCYTES UA: NEGATIVE
NITRITE UA: NEGATIVE
Protein, UA: NEGATIVE
Spec Grav, UA: 1.015 (ref 1.010–1.025)
Urobilinogen, UA: 0.2 E.U./dL
pH, UA: 6 (ref 5.0–8.0)

## 2017-03-02 LAB — URIC ACID: Uric Acid, Serum: 6.4 mg/dL (ref 2.4–7.0)

## 2017-03-02 LAB — SEDIMENTATION RATE: Sed Rate: 32 mm/hr — ABNORMAL HIGH (ref 0–30)

## 2017-03-02 MED ORDER — PREDNISONE 20 MG PO TABS: 40.0000 mg | ORAL_TABLET | Freq: Every day | ORAL | 0 refills | Status: DC

## 2017-03-02 NOTE — Patient Instructions (Addendum)
If you think we aren't getting enough fluids, can rehydrate with sports drinks (Gatorade) or even Pedialyte. The latter does not taste very good to most people. I do want her hydrated.  You can supplement her diet with protein shakes/bars.  Heat (pad or rice pillow in microwave) over affected area, 10-15 minutes every 2-3 hours while awake.   OK to take Tylenol 1000 mg (2 extra strength tabs) or 975 mg (3 regular strength tabs) every 6 hours as needed.  Let us know if you need anything.

## 2017-03-02 NOTE — Progress Notes (Signed)
Chief Complaint  Patient presents with  . Fatigue    x 2-3 weeks-along with muscle pain in the elbows and knees    Subjective: Patient is a 66 y.o. female here for fatigue. She is here with her son who helps interpret.  Over the past 2-3 weeks, she has been having achy muscles in her upper extremities and upper chest region. She had lab work done at the end of July that was unremarkable. There was no change in activity or injury. She has not tried anything at home so far. There are no palliative or provocative factors noted. She is not having any cough, fevers, shortness of breath, bruising, rashes, bug bites, or urinary complaints.   ROS: Const: No fevers Lungs: Denies SOB   Family History  Problem Relation Age of Onset  . Heart disease Father    Past Medical History:  Diagnosis Date  . Hyperlipidemia   . Osteoporosis 07/16/2016   No Known Allergies  Current Outpatient Prescriptions:  .  alendronate (FOSAMAX) 70 MG tablet, Take 1 tablet (70 mg total) by mouth every 7 (seven) days. Take with a full glass of water on an empty stomach., Disp: 4 tablet, Rfl: 11 .  Calcium Carbonate-Vitamin D (CALTRATE 600+D) 600-400 MG-UNIT tablet, Take 1 tablet by mouth 2 (two) times daily., Disp: , Rfl:  .  omeprazole (PRILOSEC) 40 MG capsule, Take 1 capsule (40 mg total) by mouth daily as needed., Disp: 90 capsule, Rfl: 1 .  predniSONE (DELTASONE) 20 MG tablet, Take 2 tablets (40 mg total) by mouth daily with breakfast., Disp: 10 tablet, Rfl: 0  Objective: BP 126/78 (BP Location: Left Arm, Patient Position: Sitting, Cuff Size: Normal)   Pulse 71   Temp 98.1 F (36.7 C) (Oral)   Ht '4\' 10"'$  (1.473 m)   Wt 116 lb 12.8 oz (53 kg)   LMP 07/27/1995   SpO2 97%   BMI 24.41 kg/m  General: Awake, appears stated age HEENT: MMM, EOMi Heart: RRR, no murmurs, no LE edema Lungs: CTAB, no rales, wheezes or rhonchi. No accessory muscle use MSK: 5/5 strength throughout, decreased extension of b/l elbows-  I felt that she was resisting me. +TTP over chest wall, no TTP over back or extremities. Gait normal Neuro: 2/4 biceps and patellar reflex bilaterally, 1/4 calcaneal reflex bilaterally, sensation is intact to light touch, no cerebellar signs, no clonus. Abd: BS+, soft, NT, ND, no masses or organomegaly Psych: Age appropriate judgment and insight, normal affect and mood  Assessment and Plan: Fatigue, unspecified type - Plan: CBC, Comprehensive metabolic panel, TSH, POCT Urinalysis Dipstick (Automated)  Myalgia - Plan: predniSONE (DELTASONE) 20 MG tablet, Sed Rate (ESR), Aldolase, ANA,IFA RA Diag Pnl w/rflx Tit/Patn, Uric acid  Orders as above. F/u in 1-1.5 weeks to recheck. If no improvement, will discuss starting a longer term medicine vs referring to rheum for 2nd opinion. This referral may be screened. The patient and her son voiced understanding and agreement to the plan.  [place], DO 03/02/17  12:30 PM

## 2017-03-03 LAB — ANA,IFA RA DIAG PNL W/RFLX TIT/PATN
Anti Nuclear Antibody(ANA): NEGATIVE
Cyclic Citrullin Peptide Ab: <16 Units
Rhuematoid fact SerPl-aCnc: <14 IU/mL (ref <14)

## 2017-03-04 ENCOUNTER — Ambulatory Visit: Payer: Medicare HMO | Admitting: Family

## 2017-03-04 LAB — ALDOLASE: Aldolase: 3.8 U/L (ref <=8.1)

## 2017-03-10 ENCOUNTER — Telehealth: Payer: Self-pay | Admitting: Family

## 2017-03-10 DIAGNOSIS — M791 Myalgia, unspecified site: Secondary | ICD-10-CM

## 2017-03-10 NOTE — Telephone Encounter (Signed)
Called and spoke with the pt's son Hailey Rasmussen) and informed him that the pt has an appt for follow-up on (03/16/17) and Dr. Nani Ravens wanted to see how she was doing then decide what the next step would be.  I asked if the Prednisone helped and he stated that the Prednisone really worked.  He said that the pt has been off the Prednisone times 3 days and the fatigue and pain has come back.  He wanted to see if the Prednisone could be refilled to get her through the next few days before her appt.  Please advise.//AB/CMA

## 2017-03-10 NOTE — Telephone Encounter (Signed)
OK to do prednisone 20 mg daily for 5 days until she sees me. TY.

## 2017-03-10 NOTE — Telephone Encounter (Signed)
Pt's son called in to request a refill on medication for mom. (Prednisone) He said that mom first felt better while taking it but now she is done and is still having pain. He would like to have medication sent to    Pharmacy:  [place], [place] - [id number] AT [address] #:

## 2017-03-11 MED ORDER — PREDNISONE 20 MG PO TABS: 20.0000 mg | ORAL_TABLET | Freq: Every day | ORAL | 0 refills | Status: DC

## 2017-03-11 NOTE — Telephone Encounter (Signed)
Called and [facility] @ 9:27am @ [phone]) informing the pt's son Nyoka Lint) that the Prednisone has been approved and sent to the pharmacy.  Informed him that the directions has been changed to 1 tablet by mouth daily times 7 days per Dr. Nani Ravens.  Asked the son to give me back if he has any questions.  New prescription sent to the pharmacy by e-script.//AB/CMA

## 2017-03-16 ENCOUNTER — Ambulatory Visit (INDEPENDENT_AMBULATORY_CARE_PROVIDER_SITE_OTHER): Payer: Medicare HMO | Admitting: Family Medicine

## 2017-03-16 ENCOUNTER — Encounter: Payer: Self-pay | Admitting: Family Medicine

## 2017-03-16 VITALS — BP 122/70 | HR 69 | Temp 98.5°F | Ht 59.0 in | Wt 117.5 lb

## 2017-03-16 DIAGNOSIS — R5383 Other fatigue: Secondary | ICD-10-CM | POA: Diagnosis not present

## 2017-03-16 DIAGNOSIS — M791 Myalgia, unspecified site: Secondary | ICD-10-CM

## 2017-03-16 MED ORDER — PREDNISONE 20 MG PO TABS: 20.0000 mg | ORAL_TABLET | Freq: Every day | ORAL | 0 refills | Status: AC

## 2017-03-16 NOTE — Progress Notes (Signed)
Pre visit review using our clinic review tool, if applicable. No additional management support is needed unless otherwise documented below in the visit note. 

## 2017-03-16 NOTE — Progress Notes (Signed)
Chief Complaint  Patient presents with  . Follow-up   The patient is here for follow-up of myalgias and fatigue. She is here with her son who helps her interpret. I evaluated her on 03/02/17. Lab work was obtained and she was started on a prednisone burst. The labs were largely unremarkable. The prednisone brought her immediately incomplete relief. Her son called asking for refill and she is prescribed a lower dose until she could be seen. She reports that the lower dose still help with symptoms, however some of the pain and fatigue returned. When she stopped the burst initially, the pain and fatigue returned after 3 days.  ROS:  MSK: +R arm and L leg pain Neuro: No weakness  BP 122/70 (BP Location: Left Arm, Patient Position: Sitting, Cuff Size: Normal)   Pulse 69   Temp 98.5 F (36.9 C) (Oral)   Ht 4\' 11"  (1.499 m)   Wt 117 lb 8 oz (53.3 kg)   LMP 07/27/1995   SpO2 96%   BMI 23.73 kg/m  Gen- awake, alert Heart- RRR Lungs- CTAB, no accessory muscle use MSK- no TTP, 5/5 strength throughout, palpation of hand joints reveals no tenderness to palpation or edema HEENT- appropriate pool of saliva at floor of mouth Neuro- 2/4 patellar and biceps reflex, 1/4 calcaneal reflex, no cerebellar signs Psych- age appropriate judgment and insight  Myalgia - Plan: Ambulatory referral to Rheumatology, predniSONE (DELTASONE) 20 MG tablet  Fatigue, unspecified type - Plan: Ambulatory referral to Rheumatology, predniSONE (DELTASONE) 20 MG tablet  Orders as above. Given her marked response to steroid, will refer to rheum. Cont 20 mg dose until then. Ideally would have her on lower dose, but she already had a drop off in efficacy since going down. F/u prn. The patient and her son voiced understanding and agreement to the plan.  Crosby Oyster [place], [place] 12:13 PM 03/16/17

## 2017-03-16 NOTE — Patient Instructions (Signed)
If you do not hear anything about your referral in the next week, call our office and ask for an update.  Take 1 pill daily until you are seen. Follow specialist's instructions after that.  Let us know if you need anything.

## 2017-03-23 ENCOUNTER — Telehealth: Payer: Self-pay | Admitting: Family

## 2017-03-23 NOTE — Telephone Encounter (Signed)
Please send referral to another Rheumatologist to see if she can get seen before Jan 2019

## 2017-03-23 NOTE — Telephone Encounter (Signed)
Patients son to inform referral for RA not able to be seen until jan 2019. He stated you told him she did not need to be on the prednisone any more than 2 months? He is not sure what to do.  Another referral somewhere else?? Seen sooner?? [phone] (doug her son)

## 2017-03-23 NOTE — Telephone Encounter (Signed)
Let's see if we can get her in with another rheumatologist please. TY.

## 2017-03-24 NOTE — Telephone Encounter (Signed)
Referral faxed to [facility], awaiting appt °

## 2017-05-24 DIAGNOSIS — R079 Chest pain, unspecified: Secondary | ICD-10-CM | POA: Diagnosis not present

## 2017-05-24 DIAGNOSIS — K210 Gastro-esophageal reflux disease with esophagitis: Secondary | ICD-10-CM | POA: Diagnosis not present

## 2017-05-24 DIAGNOSIS — R700 Elevated erythrocyte sedimentation rate: Secondary | ICD-10-CM | POA: Diagnosis not present

## 2017-05-24 DIAGNOSIS — M2550 Pain in unspecified joint: Secondary | ICD-10-CM | POA: Diagnosis not present

## 2017-05-24 DIAGNOSIS — Z6822 Body mass index (BMI) 22.0-22.9, adult: Secondary | ICD-10-CM | POA: Diagnosis not present

## 2019-06-26 DIAGNOSIS — Z20828 Contact with and (suspected) exposure to other viral communicable diseases: Secondary | ICD-10-CM | POA: Diagnosis not present

## 2019-06-26 DIAGNOSIS — R519 Headache, unspecified: Secondary | ICD-10-CM | POA: Diagnosis not present

## 2019-06-26 DIAGNOSIS — R05 Cough: Secondary | ICD-10-CM | POA: Diagnosis not present

## 2019-11-16 DIAGNOSIS — R030 Elevated blood-pressure reading, without diagnosis of hypertension: Secondary | ICD-10-CM | POA: Diagnosis not present

## 2019-12-19 ENCOUNTER — Encounter: Payer: Self-pay | Admitting: Family

## 2019-12-19 ENCOUNTER — Ambulatory Visit (HOSPITAL_BASED_OUTPATIENT_CLINIC_OR_DEPARTMENT_OTHER)
Admission: RE | Admit: 2019-12-19 | Discharge: 2019-12-19 | Disposition: A | Discharge status: Home / Self Care | Payer: Medicare HMO | Source: Ambulatory Visit | Attending: Family | Admitting: Family

## 2019-12-19 ENCOUNTER — Other Ambulatory Visit: Payer: Self-pay

## 2019-12-19 ENCOUNTER — Ambulatory Visit (INDEPENDENT_AMBULATORY_CARE_PROVIDER_SITE_OTHER): Payer: Medicare HMO | Admitting: Family

## 2019-12-19 VITALS — BP 167/75 | HR 67 | Temp 97.8°F | Resp 16 | Ht 59.0 in | Wt 120.0 lb

## 2019-12-19 DIAGNOSIS — R739 Hyperglycemia, unspecified: Secondary | ICD-10-CM

## 2019-12-19 DIAGNOSIS — R928 Other abnormal and inconclusive findings on diagnostic imaging of breast: Secondary | ICD-10-CM | POA: Diagnosis not present

## 2019-12-19 DIAGNOSIS — K219 Gastro-esophageal reflux disease without esophagitis: Secondary | ICD-10-CM

## 2019-12-19 DIAGNOSIS — E785 Hyperlipidemia, unspecified: Secondary | ICD-10-CM

## 2019-12-19 DIAGNOSIS — M810 Age-related osteoporosis without current pathological fracture: Secondary | ICD-10-CM | POA: Diagnosis not present

## 2019-12-19 DIAGNOSIS — Z1231 Encounter for screening mammogram for malignant neoplasm of breast: Secondary | ICD-10-CM | POA: Diagnosis not present

## 2019-12-19 DIAGNOSIS — M85852 Other specified disorders of bone density and structure, left thigh: Secondary | ICD-10-CM | POA: Diagnosis not present

## 2019-12-19 DIAGNOSIS — Z0000 Encounter for general adult medical examination without abnormal findings: Secondary | ICD-10-CM | POA: Diagnosis not present

## 2019-12-19 DIAGNOSIS — K439 Ventral hernia without obstruction or gangrene: Secondary | ICD-10-CM | POA: Diagnosis not present

## 2019-12-19 DIAGNOSIS — M818 Other osteoporosis without current pathological fracture: Secondary | ICD-10-CM | POA: Diagnosis not present

## 2019-12-19 DIAGNOSIS — R042 Hemoptysis: Secondary | ICD-10-CM

## 2019-12-19 LAB — COMPREHENSIVE METABOLIC PANEL
ALT: 31 U/L (ref 0–35)
AST: 33 U/L (ref 0–37)
Albumin: 4.4 g/dL (ref 3.5–5.2)
Alkaline Phosphatase: 71 U/L (ref 39–117)
BUN: 19 mg/dL (ref 6–23)
CO2: 28 mEq/L (ref 19–32)
Calcium: 9.4 mg/dL (ref 8.4–10.5)
Chloride: 105 mEq/L (ref 96–112)
Creatinine, Ser: 0.68 mg/dL (ref 0.40–1.20)
GFR: 85.31 mL/min (ref >60.00)
Glucose, Bld: 137 mg/dL — ABNORMAL HIGH (ref 70–99)
Potassium: 4.4 mEq/L (ref 3.5–5.1)
Sodium: 140 mEq/L (ref 135–145)
Total Bilirubin: 0.4 mg/dL (ref 0.2–1.2)
Total Protein: 7.2 g/dL (ref 6.0–8.3)

## 2019-12-19 LAB — LIPID PANEL
Cholesterol: 205 mg/dL — ABNORMAL HIGH (ref 0–200)
HDL: 42.6 mg/dL (ref >39.00)
NonHDL: 162.25
Total CHOL/HDL Ratio: 5
Triglycerides: 313 mg/dL — ABNORMAL HIGH (ref 0.0–149.0)
VLDL: 62.6 mg/dL — ABNORMAL HIGH (ref 0.0–40.0)

## 2019-12-19 LAB — LDL CHOLESTEROL, DIRECT: Direct LDL: 98 mg/dL

## 2019-12-19 LAB — HEMOGLOBIN A1C: Hgb A1c MFr Bld: 7.6 % — ABNORMAL HIGH (ref 4.6–6.5)

## 2019-12-19 MED ORDER — AMLODIPINE BESYLATE 5 MG PO TABS
5.0000 mg | ORAL_TABLET | Freq: Every day | ORAL | 3 refills | Status: DC
Start: 2019-12-19 — End: 2020-04-15

## 2019-12-19 NOTE — Progress Notes (Signed)
Subjective:    Patient ID: Hailey Rasmussen, female    DOB: 1950-12-10, 69 y.o.   MRN: [id number]  HPI  Patient is a 69 yr old female who presents today to re-establish care.  She was last seen back in 12/17. She states that she has not been following with another provider since her last visit with me back in 2017. [place] Interpretor is present for today's visit:  ([place])  pmhx is significant for:  Osteoporosis- She is maintained on calcium.   Hyperlipidemia- Lab Results  Component Value Date   CHOL 209 (H) 07/13/2016   HDL 50.30 07/13/2016   LDLCALC 128 (H) 03/04/2014   LDLDIRECT 113.0 07/13/2016   TRIG 272.0 (H) 07/13/2016   CHOLHDL 4 07/13/2016     GERD-  Previously on omeprazole. Reports no recent issues.   BP Readings from Last 3 Encounters:  12/19/19 (!) 167/75  03/16/17 122/70  03/02/17 126/78   Notes that she coughs up mucous in the AM,  Some blood in the mucous.  (red tinge). She is a never smoker.   Review of Systems     Past Medical History:  Diagnosis Date  . Hyperlipidemia   . Osteoporosis 07/16/2016     Social History   Socioeconomic History  . Marital status: Married    Spouse name: Not on file  . Number of children: Not on file  . Years of education: Not on file  . Highest education level: Not on file  Occupational History  . Not on file  Tobacco Use  . Smoking status: Never Smoker  . Smokeless tobacco: Never Used  Substance and Sexual Activity  . Alcohol use: No    Alcohol/week: 0.0 standard drinks  . Drug use: Not on file  . Sexual activity: Not on file  Other Topics Concern  . Not on file  Social History Narrative   Lives with her husband and son   She has 7 children (one daughter died at age 16) 51 children all live in [place]   Retired homemaker   She completed high school   Moved to [place] from [place] in [place] Strain:   . Difficulty of Paying Living Expenses:   Food  Insecurity:   . Worried About Charity fundraiser in the Last Year:   . Arboriculturist in the Last Year:   Transportation Needs:   . Film/video editor (Medical):   Marland Kitchen Lack of Transportation (Non-Medical):   Physical Activity:   . Days of Exercise per Week:   . Minutes of Exercise per Session:   Stress:   . Feeling of Stress :   Social Connections:   . Frequency of Communication with Friends and Family:   . Frequency of Social Gatherings with Friends and Family:   . Attends Religious Services:   . Active Member of Clubs or Organizations:   . Attends Archivist Meetings:   Marland Kitchen Marital Status:   Intimate Partner Violence:   . Fear of Current or Ex-Partner:   . Emotionally Abused:   Marland Kitchen Physically Abused:   . Sexually Abused:     No past surgical history on file.  Family History  Problem Relation Age of Onset  . Heart disease Father     No Known Allergies  Current Outpatient Medications on File Prior to Visit  Medication Sig Dispense Refill  . Calcium Carbonate-Vitamin D (CALTRATE 600+D)  600-400 MG-UNIT tablet Take 1 tablet by mouth 2 (two) times daily.    Marland Kitchen alendronate (FOSAMAX) 70 MG tablet Take 1 tablet (70 mg total) by mouth every 7 (seven) days. Take with a full glass of water on an empty stomach. (Patient not taking: Reported on 12/19/2019) 4 tablet 11  . omeprazole (PRILOSEC) 40 MG capsule Take 1 capsule (40 mg total) by mouth daily as needed. (Patient not taking: Reported on 12/19/2019) 90 capsule 1   No current facility-administered medications on file prior to visit.    BP (!) 167/75 (BP Location: Right Arm, Patient Position: Sitting, Cuff Size: Small)   Pulse 67   Temp 97.8 F (36.6 C) (Temporal)   Resp 16   Ht 4\' 11"  (1.499 m)   Wt 120 lb (54.4 kg)   LMP 07/27/1995   SpO2 97%   BMI 24.24 kg/m    Objective:   Physical Exam Constitutional:      Appearance: She is well-developed.  Cardiovascular:     Rate and Rhythm: Normal rate and regular  rhythm.     Heart sounds: Normal heart sounds. No murmur.  Pulmonary:     Effort: Pulmonary effort is normal. No respiratory distress.     Breath sounds: Examination of the right-lower field reveals rales. Rales present. No wheezing.  Abdominal:       Comments: Non-reducible ventral hernia noted, non-tender  Psychiatric:        Behavior: Behavior normal.        Thought Content: Thought content normal.        Judgment: Judgment normal.           Assessment & Plan:  Hemoptysis- abnormal RLL lung exam. Will begin with cxr for further evaluation.  Ventral hernia- new. Will refer for CT abd/pelvis.  HTN- new.  Will add amlodipine once daily.  gerd- stable without meds.  Hyperlipidemia- obtain follow up lipid panel.  Osteoporosis- not taking fosamax.  Advised pt to continue caltrate, walking for bone health. Will repeat dexa scan. Consider restarting fosamax pending results review.  Hyperglycemia- check follow up A1c.   This visit occurred during the SARS-CoV-2 public health emergency.  Safety protocols were in place, including screening questions prior to the visit, additional usage of staff PPE, and extensive cleaning of exam room while observing appropriate contact time as indicated for disinfecting solutions.

## 2019-12-19 NOTE — Patient Instructions (Signed)
Please begin amlodipine once daily for blood pressure. Complete lab work prior to leaving. Complete x-ray on the first floor.

## 2019-12-20 ENCOUNTER — Other Ambulatory Visit: Payer: Self-pay | Admitting: Family

## 2019-12-20 DIAGNOSIS — R042 Hemoptysis: Secondary | ICD-10-CM

## 2019-12-20 DIAGNOSIS — R928 Other abnormal and inconclusive findings on diagnostic imaging of breast: Secondary | ICD-10-CM

## 2019-12-20 NOTE — Telephone Encounter (Addendum)
Please contact patient's daughter and let her know that Please let pt know that the radiologist would like her to complete some additional breast images for further evaluation. Let me know if she has not been contacted by them about a follow up appointment in 1 week.    her chest x-ray looks normal.  However been coughing up blood, I would like to refer her to a lung doctor for further evaluation.  I have pended referral to pulmonology.  Also, lab work shows she has diabetes.  I would like her to add metformin twice daily for sugar and atorvastatin once daily for cholesterol. Let's see her back as scheduled 6/14.

## 2019-12-21 ENCOUNTER — Telehealth: Payer: Self-pay | Admitting: Family

## 2019-12-21 MED ORDER — METFORMIN HCL 500 MG PO TABS
500.0000 mg | ORAL_TABLET | Freq: Two times a day (BID) | ORAL | 1 refills | Status: DC
Start: 2019-12-21 — End: 2020-06-16

## 2019-12-21 MED ORDER — ATORVASTATIN CALCIUM 10 MG PO TABS
10.0000 mg | ORAL_TABLET | Freq: Every day | ORAL | 1 refills | Status: DC
Start: 2019-12-21 — End: 2020-06-27

## 2019-12-21 MED ORDER — ALENDRONATE SODIUM 70 MG PO TABS
70.0000 mg | ORAL_TABLET | ORAL | 11 refills | Status: DC
Start: 2019-12-21 — End: 2020-10-01

## 2019-12-21 NOTE — Telephone Encounter (Signed)
Bone density is unchanged. I would like her to restart fosamax.  Please call daughter.

## 2019-12-21 NOTE — Telephone Encounter (Signed)
Results and providers coments given to patient's son Hailey Rasmussen, he verbalized understanding.

## 2019-12-21 NOTE — Telephone Encounter (Signed)
Results and providers coments given to patient's son Timmothy Sours, he verbalized understanding. Medications and referral signed.

## 2019-12-26 ENCOUNTER — Encounter (HOSPITAL_BASED_OUTPATIENT_CLINIC_OR_DEPARTMENT_OTHER): Payer: Self-pay

## 2019-12-26 ENCOUNTER — Other Ambulatory Visit: Payer: Self-pay

## 2019-12-26 ENCOUNTER — Ambulatory Visit (HOSPITAL_BASED_OUTPATIENT_CLINIC_OR_DEPARTMENT_OTHER)
Admission: RE | Admit: 2019-12-26 | Discharge: 2019-12-26 | Disposition: A | Discharge status: Home / Self Care | Payer: Medicare HMO | Source: Ambulatory Visit | Attending: Family | Admitting: Family

## 2019-12-26 DIAGNOSIS — K439 Ventral hernia without obstruction or gangrene: Secondary | ICD-10-CM | POA: Insufficient documentation

## 2019-12-26 DIAGNOSIS — K429 Umbilical hernia without obstruction or gangrene: Secondary | ICD-10-CM | POA: Diagnosis not present

## 2019-12-26 MED ORDER — IOHEXOL 300 MG/ML  SOLN
100.0000 mL | Freq: Once | INTRAMUSCULAR | Status: AC | PRN
  Administered 2019-12-26: 100 mL via INTRAVENOUS

## 2019-12-27 ENCOUNTER — Encounter: Payer: Self-pay | Admitting: Family

## 2019-12-27 ENCOUNTER — Telehealth: Payer: Self-pay | Admitting: Family

## 2019-12-27 DIAGNOSIS — K760 Fatty (change of) liver, not elsewhere classified: Secondary | ICD-10-CM

## 2019-12-27 DIAGNOSIS — K439 Ventral hernia without obstruction or gangrene: Secondary | ICD-10-CM

## 2019-12-27 NOTE — Telephone Encounter (Signed)
Results given to patient's son. He verbalized understanding and will tell her mother about the findings and provider's recommendations.

## 2019-12-27 NOTE — Telephone Encounter (Signed)
CT shows a small hernia near her belly button. At this point, I would recommend that we just watch it.  If it becomes larger in the future we can refer her to a Psychologist, sport and exercise. If she ever develops severe pain there, then she should go to the ER.  CT also shows fatty liver.  I would recommend that she work on a low fat/low cholesterol diet and exercise.

## 2020-01-03 ENCOUNTER — Other Ambulatory Visit: Payer: Medicare HMO

## 2020-01-04 ENCOUNTER — Other Ambulatory Visit: Payer: Self-pay

## 2020-01-04 ENCOUNTER — Ambulatory Visit
Admission: RE | Admit: 2020-01-04 | Discharge: 2020-01-04 | Disposition: A | Discharge status: Home / Self Care | Payer: Medicare HMO | Source: Ambulatory Visit | Attending: Family | Admitting: Family

## 2020-01-04 DIAGNOSIS — N6002 Solitary cyst of left breast: Secondary | ICD-10-CM | POA: Diagnosis not present

## 2020-01-04 DIAGNOSIS — R928 Other abnormal and inconclusive findings on diagnostic imaging of breast: Secondary | ICD-10-CM

## 2020-01-07 ENCOUNTER — Other Ambulatory Visit: Payer: Self-pay

## 2020-01-07 ENCOUNTER — Encounter: Payer: Self-pay | Admitting: Family

## 2020-01-07 ENCOUNTER — Ambulatory Visit (INDEPENDENT_AMBULATORY_CARE_PROVIDER_SITE_OTHER): Payer: Medicare HMO | Admitting: Family

## 2020-01-07 VITALS — BP 135/59 | HR 62 | Temp 97.6°F | Resp 16 | Ht 58.5 in | Wt 121.0 lb

## 2020-01-07 DIAGNOSIS — I10 Essential (primary) hypertension: Secondary | ICD-10-CM

## 2020-01-07 DIAGNOSIS — Z0000 Encounter for general adult medical examination without abnormal findings: Secondary | ICD-10-CM

## 2020-01-07 DIAGNOSIS — R042 Hemoptysis: Secondary | ICD-10-CM

## 2020-01-07 DIAGNOSIS — E1165 Type 2 diabetes mellitus with hyperglycemia: Secondary | ICD-10-CM

## 2020-01-07 LAB — MICROALBUMIN / CREATININE URINE RATIO
Creatinine,U: 25 mg/dL
Microalb Creat Ratio: 2.8 mg/g (ref 0.0–30.0)
Microalb, Ur: <0.7 mg/dL (ref 0.0–1.9)

## 2020-01-07 MED ORDER — BLOOD GLUCOSE MONITOR KIT: PACK | 0 refills | Status: DC

## 2020-01-07 MED ORDER — SHINGRIX 50 MCG/0.5ML IM SUSR: INTRAMUSCULAR | 1 refills | Status: DC

## 2020-01-07 NOTE — Patient Instructions (Addendum)
Please call Pulmonology to schedule an appointment for the blood in your mucous [phone]. Check your sugar 1-2 times daily.  Goal sugars are 80-110 before breakfast and <140 after meals. Please get your shingles shot at the pharmacy. You should be contacted about scheduling your colonoscopy.

## 2020-01-07 NOTE — Progress Notes (Signed)
Subjective:    Patient ID: Hailey Rasmussen, female    DOB: 1950-12-13, 69 y.o.   MRN: [id number]  HPI   Patient is a 69 yr old female who presents today for complete physical.  She is accompanied today by her daughter.  Immunizations: tdap 2015, completed covid vaccine Diet: diet is healthy Exercise: walks, works in the garden [employer] from Last 3 Encounters:  01/07/20 121 lb (54.9 kg)  12/19/19 120 lb (54.4 kg)  03/16/17 117 lb 8 oz (53.3 kg)    Colonoscopy: due Dexa: 12/19/19 Pap Smear: n/a Mammogram: 5/21 Vision: due Dental: due  Diabetes type 2- this is a fairly new diagnosis for her. She is working on diet but is not on any oral agents.   Lab Results  Component Value Date   HGBA1C 7.6 (H) 12/19/2019    HTN- last time we added amlodipine 5mg  once daily.   BP Readings from Last 3 Encounters:  01/07/20 (!) 135/59  12/19/19 (!) 167/75  03/16/17 122/70   Hemoptysis- last visit we obtained an x-ray of her chest.  Chest x-ray was clear. A referral was made to pulmonology.  They tell me that she has not been contacted by pulmonary to schedule this appointment yet.  Review of Systems  Constitutional: Negative for unexpected weight change.  HENT: Negative for rhinorrhea.   Respiratory: Negative for cough and shortness of breath.   Cardiovascular: Negative for chest pain.  Gastrointestinal: Negative for abdominal pain, constipation and diarrhea.  Genitourinary: Negative for dysuria and hematuria.  Musculoskeletal: Negative for arthralgias.  Skin: Negative for rash.  Neurological: Positive for headaches (reports occasional right sided headache).  Hematological: Negative for adenopathy.  Psychiatric/Behavioral:       Denies depression    Past Medical History:  Diagnosis Date  . Fatty liver   . Hyperlipidemia   . Osteoporosis 07/16/2016  . Ventral hernia      Social History   Socioeconomic History  . Marital status: Married    Spouse name: Not on file  .  Number of children: Not on file  . Years of education: Not on file  . Highest education level: Not on file  Occupational History  . Not on file  Tobacco Use  . Smoking status: Never Smoker  . Smokeless tobacco: Never Used  Substance and Sexual Activity  . Alcohol use: No    Alcohol/week: 0.0 standard drinks  . Drug use: Not on file  . Sexual activity: Not on file  Other Topics Concern  . Not on file  Social History Narrative   Lives with her husband and son   She has 7 children (one daughter died at age 60) 17 children all live in [place]   Retired homemaker   She completed high school   Moved to [place] from [place] in [place] Strain:   . Difficulty of Paying Living Expenses:   Food Insecurity:   . Worried About Charity fundraiser in the Last Year:   . Arboriculturist in the Last Year:   Transportation Needs:   . Film/video editor (Medical):   Marland Kitchen Lack of Transportation (Non-Medical):   Physical Activity:   . Days of Exercise per Week:   . Minutes of Exercise per Session:   Stress:   . Feeling of Stress :   Social Connections:   . Frequency of Communication with Friends and Family:   .  Frequency of Social Gatherings with Friends and Family:   . Attends Religious Services:   . Active Member of Clubs or Organizations:   . Attends Archivist Meetings:   Marland Kitchen Marital Status:   Intimate Partner Violence:   . Fear of Current or Ex-Partner:   . Emotionally Abused:   Marland Kitchen Physically Abused:   . Sexually Abused:     No past surgical history on file.  Family History  Problem Relation Age of Onset  . Heart disease Father     No Known Allergies  Current Outpatient Medications on File Prior to Visit  Medication Sig Dispense Refill  . alendronate (FOSAMAX) 70 MG tablet Take 1 tablet (70 mg total) by mouth every 7 (seven) days. Take with a full glass of water on an empty stomach. 4 tablet 11  . amLODipine (NORVASC) 5  MG tablet Take 1 tablet (5 mg total) by mouth daily. 30 tablet 3  . atorvastatin (LIPITOR) 10 MG tablet Take 1 tablet (10 mg total) by mouth daily. 90 tablet 1  . Calcium Carbonate-Vitamin D (CALTRATE 600+D) 600-400 MG-UNIT tablet Take 1 tablet by mouth 2 (two) times daily.    . metFORMIN (GLUCOPHAGE) 500 MG tablet Take 1 tablet (500 mg total) by mouth 2 (two) times daily with a meal. 180 tablet 1   No current facility-administered medications on file prior to visit.    BP (!) 135/59 (BP Location: Right Arm, Patient Position: Sitting, Cuff Size: Small)   Pulse 62   Temp 97.6 F (36.4 C) (Temporal)   Resp 16   Ht 4' 10.5" (1.486 m)   Wt 121 lb (54.9 kg)   LMP 07/27/1995   SpO2 99%   BMI 24.86 kg/m       Objective:   Physical Exam  Physical Exam  Constitutional: She is oriented to person, place, and time. She appears well-developed and well-nourished. No distress.  HENT:  Head: Normocephalic and atraumatic.  Right Ear: Tympanic membrane and ear canal normal.  Left Ear: Tympanic membrane and ear canal normal.  Mouth/Throat: Not examined, patient wearing mask Eyes: Pupils are equal, round, and reactive to light. No scleral icterus.  Neck: Normal range of motion. No thyromegaly present.  Cardiovascular: Normal rate and regular rhythm.   No murmur heard. Pulmonary/Chest: Effort normal and breath sounds normal. No respiratory distress. He has no wheezes. She has no rales. She exhibits no tenderness.  Abdominal: Soft. Bowel sounds are normal. She exhibits no distension and no mass. There is no tenderness. There is no rebound and no guarding.  Musculoskeletal: She exhibits no edema.  Lymphadenopathy:    She has no cervical adenopathy.  Neurological: She is alert and oriented to person, place, and time. She has normal patellar reflexes. She exhibits normal muscle tone. Coordination normal.  Skin: Skin is warm and dry.  Psychiatric: She has a normal mood and affect. Her behavior is  normal. Judgment and thought content normal.  Breast/pelvis: Deferred  Diabetic Foot Exam - Simple   Simple Foot Form Diabetic Foot exam was performed with the following findings: Yes 01/07/2020  2:51 PM  Visual Inspection No deformities, no ulcerations, no other skin breakdown bilaterally: Yes Sensation Testing Intact to touch and monofilament testing bilaterally: Yes Pulse Check Posterior Tibialis and Dorsalis pulse intact bilaterally: Yes Comments           Assessment & Plan:  Preventive care-recommended that she obtain the Shingrix vaccine at her pharmacy.  She is due for colonoscopy and  I have placed that referral.  She is agreeable to proceed.  Mammogram is up-to-date.  Bone density is also up-to-date.  Hypertension-blood pressure is improved on amlodipine.  Continue same.  Diabetes type 2-will obtain urine microalbumin.  Diabetic foot exam is performed today.  Will refer for diabetic eye exam.  We did discuss checking her sugars at home.  I have given her a prescription for glucometer, strips and lancets.  I have advised the patient and daughter to schedule a nurse visit for teaching.  We also discussed glycemic goals and diabetic diet today.  Hemoptysis-I have advised the patient to schedule an appointment with pulmonology and I have given them the number to call.  This visit occurred during the SARS-CoV-2 public health emergency.  Safety protocols were in place, including screening questions prior to the visit, additional usage of staff PPE, and extensive cleaning of exam room while observing appropriate contact time as indicated for disinfecting solutions.        Assessment & Plan:

## 2020-01-09 DIAGNOSIS — R69 Illness, unspecified: Secondary | ICD-10-CM | POA: Diagnosis not present

## 2020-01-21 DIAGNOSIS — H35371 Puckering of macula, right eye: Secondary | ICD-10-CM | POA: Diagnosis not present

## 2020-01-21 DIAGNOSIS — H43811 Vitreous degeneration, right eye: Secondary | ICD-10-CM | POA: Diagnosis not present

## 2020-01-21 DIAGNOSIS — H25813 Combined forms of age-related cataract, bilateral: Secondary | ICD-10-CM | POA: Diagnosis not present

## 2020-01-21 DIAGNOSIS — E119 Type 2 diabetes mellitus without complications: Secondary | ICD-10-CM | POA: Diagnosis not present

## 2020-01-21 LAB — HM DIABETES EYE EXAM

## 2020-01-31 ENCOUNTER — Other Ambulatory Visit: Payer: Self-pay | Admitting: Family Medicine

## 2020-01-31 DIAGNOSIS — R69 Illness, unspecified: Secondary | ICD-10-CM | POA: Diagnosis not present

## 2020-02-01 ENCOUNTER — Encounter: Payer: Self-pay | Admitting: Family

## 2020-02-05 ENCOUNTER — Other Ambulatory Visit: Payer: Self-pay | Admitting: Family Medicine

## 2020-02-11 ENCOUNTER — Telehealth: Payer: Self-pay

## 2020-02-11 NOTE — Telephone Encounter (Signed)
Per patient's son he has not receive a call from GI, Phone number provided for him to call them.

## 2020-02-11 NOTE — Telephone Encounter (Signed)
Pt's Son, Nyoka Lint, wants to know if pt has ever had a colonoscopy before.  CB for Nyoka Lint is [phone].

## 2020-02-13 DIAGNOSIS — R69 Illness, unspecified: Secondary | ICD-10-CM | POA: Diagnosis not present

## 2020-02-20 ENCOUNTER — Encounter: Payer: Self-pay | Admitting: Gastroenterology

## 2020-04-15 ENCOUNTER — Other Ambulatory Visit: Payer: Self-pay | Admitting: Family

## 2020-04-24 ENCOUNTER — Encounter: Payer: Medicare HMO | Admitting: Gastroenterology

## 2020-05-08 ENCOUNTER — Other Ambulatory Visit: Payer: Self-pay

## 2020-05-08 ENCOUNTER — Ambulatory Visit (AMBULATORY_SURGERY_CENTER): Payer: Self-pay | Admitting: *Deleted

## 2020-05-08 VITALS — Ht 58.5 in | Wt 115.8 lb

## 2020-05-08 DIAGNOSIS — Z1211 Encounter for screening for malignant neoplasm of colon: Secondary | ICD-10-CM

## 2020-05-08 NOTE — Progress Notes (Signed)
Pt's son, Nyoka Lint To is with her to interpret.  Appropriate form filled out  Completed covid vaccines 11-03-19  Pt is aware that care partner will wait in the car during procedure; if they feel like they will be too hot or cold to wait in the car; they may wait in the 4 th floor lobby. Patient is aware to bring only one care partner. We want them to wear a mask (we do not have any that we can provide them), practice social distancing, and we will check their temperatures when they get here.  I did remind the patient that their care partner needs to stay in the parking lot the entire time and have a cell phone available, we will call them when the pt is ready for discharge. Patient will wear mask into building.  No trouble moving neck   No egg or soy allergy  No home oxygen use   No medications for weight loss taken  Pt denies constipation issues

## 2020-05-09 ENCOUNTER — Encounter: Payer: Self-pay | Admitting: Gastroenterology

## 2020-05-10 DIAGNOSIS — R69 Illness, unspecified: Secondary | ICD-10-CM | POA: Diagnosis not present

## 2020-05-11 DIAGNOSIS — R69 Illness, unspecified: Secondary | ICD-10-CM | POA: Diagnosis not present

## 2020-05-11 DIAGNOSIS — Z23 Encounter for immunization: Secondary | ICD-10-CM | POA: Diagnosis not present

## 2020-05-20 ENCOUNTER — Encounter: Payer: Medicare HMO | Admitting: Gastroenterology

## 2020-05-23 ENCOUNTER — Encounter: Payer: Medicare HMO | Admitting: Gastroenterology

## 2020-05-27 ENCOUNTER — Ambulatory Visit (AMBULATORY_SURGERY_CENTER): Payer: Medicare HMO | Admitting: Gastroenterology

## 2020-05-27 ENCOUNTER — Encounter: Payer: Self-pay | Admitting: Gastroenterology

## 2020-05-27 ENCOUNTER — Other Ambulatory Visit: Payer: Self-pay

## 2020-05-27 VITALS — BP 136/69 | HR 56 | Temp 99.0°F | Resp 22 | Ht <= 58 in | Wt 115.0 lb

## 2020-05-27 DIAGNOSIS — Z1211 Encounter for screening for malignant neoplasm of colon: Secondary | ICD-10-CM | POA: Diagnosis not present

## 2020-05-27 DIAGNOSIS — D122 Benign neoplasm of ascending colon: Secondary | ICD-10-CM | POA: Diagnosis not present

## 2020-05-27 MED ORDER — SODIUM CHLORIDE 0.9 % IV SOLN: 500.0000 mL | Freq: Once | INTRAVENOUS | Status: DC

## 2020-05-27 NOTE — Op Note (Signed)
[facility] Patient Name: Hailey Rasmussen Procedure Date: 05/27/2020 11:00 AM MRN: [id number] Endoscopist: Justice Britain , MD Age: 69 Referring MD:  Date of Birth: 1951/01/05 Gender: Female Account #: [id number] Procedure:                Colonoscopy Indications:              Screening for colorectal malignant neoplasm Medicines:                Monitored Anesthesia Care Procedure:                Pre-Anesthesia Assessment:                           - Prior to the procedure, a History and Physical                            was performed, and patient medications and                            allergies were reviewed. The patient's tolerance of                            previous anesthesia was also reviewed. The risks                            and benefits of the procedure and the sedation                            options and risks were discussed with the patient.                            All questions were answered, and informed consent                            was obtained. Prior Anticoagulants: The patient has                            taken no previous anticoagulant or antiplatelet                            agents. ASA Grade Assessment: III - A patient with                            severe systemic disease. After reviewing the risks                            and benefits, the patient was deemed in                            satisfactory condition to undergo the procedure.                           After obtaining informed consent, the colonoscope  was passed under direct vision. Throughout the                            procedure, the patient's blood pressure, pulse, and                            oxygen saturations were monitored continuously. The                            Colonoscope was introduced through the anus and                            advanced to the 5 cm into the ileum. The                            colonoscopy was  performed without difficulty. The                            patient tolerated the procedure. The quality of the                            bowel preparation was good. The terminal ileum,                            ileocecal valve, appendiceal orifice, and rectum                            were photographed. Scope In: 11:19:53 AM Scope Out: 11:34:38 AM Scope Withdrawal Time: 0 hours 11 minutes 33 seconds  Total Procedure Duration: 0 hours 14 minutes 45 seconds  Findings:                 Skin tags were found on perianal exam.                           The digital rectal exam findings include                            hemorrhoids. Pertinent negatives include no                            palpable rectal lesions.                           The terminal ileum and ileocecal valve appeared                            normal.                           A 5 mm polyp was found in the ascending colon. The                            polyp was sessile. The polyp was removed with a  cold snare. Resection and retrieval were complete.                           Multiple small-mouthed diverticula were found in                            the descending colon, transverse colon, hepatic                            flexure and ascending colon.                           Normal mucosa was found in the entire colon                            otherwise.                           Non-bleeding non-thrombosed internal hemorrhoids                            were found during retroflexion, during perianal                            exam and during digital exam. The hemorrhoids were                            Grade II (internal hemorrhoids that prolapse but                            reduce spontaneously). Complications:            No immediate complications. Estimated Blood Loss:     Estimated blood loss was minimal. Impression:               - Perianal skin tags found on perianal exam.                            - Hemorrhoids found on digital rectal exam.                           - The examined portion of the ileum was normal.                           - One 5 mm polyp in the ascending colon, removed                            with a cold snare. Resected and retrieved.                           - Diverticulosis in the descending colon, in the                            transverse colon, at the hepatic flexure and in the  ascending colon.                           - Normal mucosa in the entire examined colon                            otherwise.                           - Non-bleeding non-thrombosed internal hemorrhoids. Recommendation:           - The patient will be observed post-procedure,                            until all discharge criteria are met.                           - Discharge patient to home.                           - Patient has a contact number available for                            emergencies. The signs and symptoms of potential                            delayed complications were discussed with the                            patient. Return to normal activities tomorrow.                            Written discharge instructions were provided to the                            patient.                           - High fiber diet.                           - Use FiberCon 1-2 tablets PO daily.                           - Continue present medications.                           - Await pathology results.                           - Repeat colonoscopy in 11/29/08 years for                            surveillance based on pathology results and                            findings of adenomatous vs serrated tissue.                           -  The findings and recommendations were discussed                            with the patient.                           - The findings and recommendations were discussed                             with the patient's family. Justice Britain, MD 05/27/2020 11:39:16 AM

## 2020-05-27 NOTE — Progress Notes (Signed)
Vital signs checked by:Sibley  The patient states no changes in medical or surgical history since pre-visit screening on 05/08/20.

## 2020-05-27 NOTE — Progress Notes (Signed)
Called to room to assist during endoscopic procedure.  Patient ID and intended procedure confirmed with present staff. Received instructions for my participation in the procedure from the performing physician.  

## 2020-05-27 NOTE — Progress Notes (Signed)
Son is interpreting and it is necessary to coach him to tell the patient what we are saying.  The doctor is aware of this, and he did repeat many things.

## 2020-05-27 NOTE — Progress Notes (Signed)
pt tolerated well. VSS. awake and to recovery. Report given to RN.  

## 2020-05-27 NOTE — Patient Instructions (Signed)
Read all of the handouts given to you by your recovery room nurse.  Your son can translate for you since your refused an interpreter.  YOU HAD AN ENDOSCOPIC PROCEDURE TODAY AT [facility]:   Refer to the procedure report that was given to you for any specific questions about what was found during the examination.  If the procedure report does not answer your questions, please call your gastroenterologist to clarify.  If you requested that your care partner not be given the details of your procedure findings, then the procedure report has been included in a sealed envelope for you to review at your convenience later.  YOU SHOULD EXPECT: Some feelings of bloating in the abdomen. Passage of more gas than usual.  Walking can help get rid of the air that was put into your GI tract during the procedure and reduce the bloating. If you had a lower endoscopy (such as a colonoscopy or flexible sigmoidoscopy) you may notice spotting of blood in your stool or on the toilet paper. If you underwent a bowel prep for your procedure, you may not have a normal bowel movement for a few days.  Please Note:  You might notice some irritation and congestion in your nose or some drainage.  This is from the oxygen used during your procedure.  There is no need for concern and it should clear up in a day or so.  SYMPTOMS TO REPORT IMMEDIATELY:   Following lower endoscopy (colonoscopy or flexible sigmoidoscopy):  Excessive amounts of blood in the stool  Significant tenderness or worsening of abdominal pains  Swelling of the abdomen that is new, acute  Fever of 100F or higher   For urgent or emergent issues, a gastroenterologist can be reached at any hour by calling [phone]. Do not use MyChart messaging for urgent concerns.    DIET:  We do recommend a small meal at first, but then you may proceed to your regular diet.  Drink plenty of fluids but you should avoid alcoholic beverages for 24  hours.  ACTIVITY:  You should plan to take it easy for the rest of today and you should NOT DRIVE or use heavy machinery until tomorrow (because of the sedation medicines used during the test).    FOLLOW UP: Our staff will call the number listed on your records 48-72 hours following your procedure to check on you and address any questions or concerns that you may have regarding the information given to you following your procedure. If we do not reach you, we will leave a message.  We will attempt to reach you two times.  During this call, we will ask if you have developed any symptoms of COVID 19. If you develop any symptoms (ie: fever, flu-like symptoms, shortness of breath, cough etc.) before then, please call [phone].  If you test positive for Covid 19 in the 2 weeks post procedure, please call and report this information to [place].    If any biopsies were taken you will be contacted by phone or by letter within the next 1-3 weeks.  Please call us at [phone] if you have not heard about the biopsies in 3 weeks.    SIGNATURES/CONFIDENTIALITY: You and/or your care partner have signed paperwork which will be entered into your electronic medical record.  These signatures attest to the fact that that the information above on your After Visit Summary has been reviewed and is understood.  Full responsibility of the confidentiality of this  discharge information lies with you and/or your care-partner. 

## 2020-05-29 ENCOUNTER — Telehealth: Payer: Self-pay | Admitting: *Deleted

## 2020-05-29 NOTE — Telephone Encounter (Signed)
No answer. Left VM 

## 2020-05-29 NOTE — Telephone Encounter (Signed)
Left message for patient to call with questions or concerns.

## 2020-06-05 ENCOUNTER — Encounter: Payer: Self-pay | Admitting: Gastroenterology

## 2020-06-16 ENCOUNTER — Other Ambulatory Visit: Payer: Self-pay | Admitting: Family

## 2020-06-25 ENCOUNTER — Other Ambulatory Visit: Payer: Self-pay | Admitting: Family

## 2020-07-18 ENCOUNTER — Other Ambulatory Visit: Payer: Self-pay | Admitting: Family

## 2020-07-21 NOTE — Telephone Encounter (Signed)
Spoke to patient son Roger Shelter in regards to scheduling an appointment. Son states he will talk to his mom and call us back to schedule.

## 2020-07-21 NOTE — Telephone Encounter (Signed)
Please contact pt's son to schedule a follow up appointment. I sent a 30 day supply of medication but she will need OV prior to additional refills-

## 2020-08-19 ENCOUNTER — Other Ambulatory Visit: Payer: Self-pay | Admitting: Family

## 2020-09-19 ENCOUNTER — Other Ambulatory Visit: Payer: Self-pay | Admitting: Family

## 2020-09-20 ENCOUNTER — Other Ambulatory Visit: Payer: Self-pay | Admitting: Family

## 2020-09-21 NOTE — Telephone Encounter (Signed)
Please contact son to schedule pt for follow up visit.

## 2020-09-22 NOTE — Telephone Encounter (Signed)
Lvm for patient's relatives to call and schedule her a follow up appointment

## 2020-09-26 NOTE — Telephone Encounter (Signed)
Patient scheduled to come in 10-01-2020

## 2020-10-01 ENCOUNTER — Ambulatory Visit (INDEPENDENT_AMBULATORY_CARE_PROVIDER_SITE_OTHER): Payer: Medicare HMO | Admitting: Family

## 2020-10-01 ENCOUNTER — Encounter: Payer: Self-pay | Admitting: Family

## 2020-10-01 ENCOUNTER — Other Ambulatory Visit: Payer: Self-pay

## 2020-10-01 VITALS — BP 152/65 | HR 63 | Temp 97.9°F | Resp 16 | Ht <= 58 in | Wt 117.2 lb

## 2020-10-01 DIAGNOSIS — E785 Hyperlipidemia, unspecified: Secondary | ICD-10-CM | POA: Diagnosis not present

## 2020-10-01 DIAGNOSIS — R042 Hemoptysis: Secondary | ICD-10-CM

## 2020-10-01 DIAGNOSIS — E1165 Type 2 diabetes mellitus with hyperglycemia: Secondary | ICD-10-CM | POA: Diagnosis not present

## 2020-10-01 DIAGNOSIS — M810 Age-related osteoporosis without current pathological fracture: Secondary | ICD-10-CM

## 2020-10-01 LAB — COMPREHENSIVE METABOLIC PANEL
ALT: 15 U/L (ref 0–35)
AST: 18 U/L (ref 0–37)
Albumin: 4.5 g/dL (ref 3.5–5.2)
Alkaline Phosphatase: 58 U/L (ref 39–117)
BUN: 13 mg/dL (ref 6–23)
CO2: 30 mEq/L (ref 19–32)
Calcium: 9.9 mg/dL (ref 8.4–10.5)
Chloride: 104 mEq/L (ref 96–112)
Creatinine, Ser: 0.65 mg/dL (ref 0.40–1.20)
GFR: 88.39 mL/min (ref >60.00)
Glucose, Bld: 110 mg/dL — ABNORMAL HIGH (ref 70–99)
Potassium: 4.6 mEq/L (ref 3.5–5.1)
Sodium: 141 mEq/L (ref 135–145)
Total Bilirubin: 0.5 mg/dL (ref 0.2–1.2)
Total Protein: 7.8 g/dL (ref 6.0–8.3)

## 2020-10-01 LAB — LIPID PANEL
Cholesterol: 169 mg/dL (ref 0–200)
HDL: 49.5 mg/dL (ref >39.00)
LDL Cholesterol: 82 mg/dL (ref 0–99)
NonHDL: 119
Total CHOL/HDL Ratio: 3
Triglycerides: 184 mg/dL — ABNORMAL HIGH (ref 0.0–149.0)
VLDL: 36.8 mg/dL (ref 0.0–40.0)

## 2020-10-01 LAB — HEMOGLOBIN A1C: Hgb A1c MFr Bld: 6.3 % (ref 4.6–6.5)

## 2020-10-01 MED ORDER — AMLODIPINE BESYLATE 5 MG PO TABS: 5.0000 mg | ORAL_TABLET | Freq: Every day | ORAL | 1 refills | Status: DC

## 2020-10-01 MED ORDER — ALENDRONATE SODIUM 70 MG PO TABS: 70.0000 mg | ORAL_TABLET | ORAL | 11 refills | Status: DC

## 2020-10-01 MED ORDER — METFORMIN HCL 500 MG PO TABS: ORAL_TABLET | ORAL | 1 refills | Status: DC

## 2020-10-01 MED ORDER — ATORVASTATIN CALCIUM 10 MG PO TABS: ORAL_TABLET | ORAL | 1 refills | Status: DC

## 2020-10-01 NOTE — Progress Notes (Signed)
Subjective:    Patient ID: Hailey Rasmussen, female    DOB: 11-13-1950, 70 y.o.   MRN: [id number]  HPI  Patient is a 70 yr old female who presents today for follow up.  She is accompanied by her son today who assists with interpretation.  DM2- maintained on metformin 500 mg bid. Has switched to brown rice, more vegetables, walking on the treadmill. Lab Results  Component Value Date   HGBA1C 7.6 (H) 12/19/2019   HGBA1C 6.1 07/13/2016   HGBA1C 6.1 04/05/2014   Lab Results  Component Value Date   MICROALBUR <0.7 01/07/2020   [place] 128 (H) 03/04/2014   CREATININE 0.68 12/19/2019   Hx of hemoptysis- never ended up seeing pulmonary.  Sometimes having some pink mucous.  Non-smoker.  Reports that cough is improved.    HTN- maintainend on amlodipine 30m.  Reports that home bp is 110-120. BP Readings from Last 3 Encounters:  10/01/20 (!) 152/65  05/27/20 136/69  01/07/20 (!) 135/59   Osteoporosis- she is taking fosamax.  She is also maintained on Caltrate twice daily.  Had Covid booster shot in December.  She does not have a card with her today. Review of Systems See HPI  Past Medical History:  Diagnosis Date  . [place]   . Fatty liver   . Hyperlipidemia   . Hypertension   . Osteoporosis 07/16/2016  . Ventral hernia      Social History   Socioeconomic History  . Marital status: Married    Spouse name: Not on file  . Number of children: Not on file  . Years of education: Not on file  . Highest education level: Not on file  Occupational History  . Not on file  Tobacco Use  . Smoking status: Never Smoker  . Smokeless tobacco: Never Used  Vaping Use  . Vaping Use: Never used  Substance and Sexual Activity  . Alcohol use: No    Alcohol/week: 0.0 standard drinks  . Drug use: Never  . Sexual activity: Not on file  Other Topics Concern  . Not on file  Social History Narrative   Lives with her husband and son   She has 7  children (one daughter died at age 72 660children all live in [place]  Retired homemaker   She completed high school   [place] f[place]Insecurity: Not on file  Transportation Needs: Not on file  Physical Activity: Not on file  Stress: Not on file  Social Connections: Not on file  Intimate Partner Violence: Not on file    Past Surgical History:  Procedure Laterality Date  . COLONOSCOPY      Family History  Problem Relation Age of Onset  . Heart disease Father   . Colon cancer Neg Hx   . Esophageal cancer Neg Hx   . Rectal cancer Neg Hx   . Stomach cancer Neg Hx     No Known Allergies  Current Outpatient Medications on File Prior to Visit  Medication Sig Dispense Refill  . blood glucose meter kit and supplies KIT Dispense based on patient and insurance preference. Use up to four times daily as directed. (FOR ICD-9 250.00, 250.01). 1 each 0  . Calcium Carbonate-Vitamin D (CALTRATE 600+D) 600-400 MG-UNIT tablet Take 1 tablet by mouth 2 (two) times daily.    .Marland Kitchen  Lancets (ONETOUCH DELICA PLUS [id number]) MISC USE UP TO FOUR TIMES DAILY 100 each 3  . ONETOUCH VERIO test strip USE UP TO FOUR TIMES DAILY 300 strip 2   Current Facility-Administered Medications on File Prior to Visit  Medication Dose Route Frequency Provider Last Rate Last Admin  . 0.9 %  sodium chloride infusion  500 mL Intravenous Once Mansouraty, Telford Nab., MD        BP (!) 152/65 (BP Location: Right Arm, Patient Position: Sitting, Cuff Size: Small)   Pulse 63   Temp 97.9 F (36.6 C) (Oral)   Resp 16   Ht _0  (1.473 m)   Wt 117 lb 3.2 oz (53.2 kg)   LMP 07/27/1995   SpO2 99%   BMI 24.49 kg/m       Objective:   Physical Exam Constitutional:      Appearance: She is well-developed and well-nourished.  Neck:     Thyroid: No thyromegaly.  Cardiovascular:     Rate and Rhythm: Normal rate and regular  rhythm.     Heart sounds: Normal heart sounds. No murmur heard.   Pulmonary:     Effort: Pulmonary effort is normal. No respiratory distress.     Breath sounds: Normal breath sounds. No wheezing.  Musculoskeletal:     Cervical back: Neck supple.  Skin:    General: Skin is warm and dry.  Neurological:     Mental Status: She is alert and oriented to person, place, and time.  Psychiatric:        Mood and Affect: Mood and affect normal.        Behavior: Behavior normal.        Thought Content: Thought content normal.        Judgment: Judgment normal.           Assessment & Plan:  Diabetes type 2-follow-up A1c is much improved.  Continue Metformin 500 mg p.o. twice daily as well as dietary and exercise changes.  Hypertension-blood pressure is mildly elevated today.  According to her son, home blood pressure readings are much lower.  We will continue amlodipine 5 mg p.o. daily.  History of hemoptysis-it is not clear if what she is describing is pink sputum is blood-tinged or not.  I did suggest to the son that she follow through with pulmonary referral.  He declined for me to schedule at this time and wishes to discuss with his mother further.  He will reach out to me if she decides to proceed with referral.  Osteoporosis-clinically stable continue Fosamax 70 mg p.o. weekly as well as Caltrate 600 mg p.o. twice daily  Hyperlipidemia-will obtain follow-up lipid panel.  This visit occurred during the SARS-CoV-2 public health emergency.  Safety protocols were in place, including screening questions prior to the visit, additional usage of staff PPE, and extensive cleaning of exam room while observing appropriate contact time as indicated for disinfecting solutions.

## 2020-10-01 NOTE — Patient Instructions (Signed)
I would recommend referral to the lung doctor- please discuss with your mom and let me know if she is agreeable. Please complete lab work prior to leaving.

## 2020-10-03 NOTE — Progress Notes (Signed)
Mailed out to pt 

## 2020-12-19 DIAGNOSIS — Z7983 Long term (current) use of bisphosphonates: Secondary | ICD-10-CM | POA: Diagnosis not present

## 2020-12-19 DIAGNOSIS — Z7984 Long term (current) use of oral hypoglycemic drugs: Secondary | ICD-10-CM | POA: Diagnosis not present

## 2020-12-19 DIAGNOSIS — M810 Age-related osteoporosis without current pathological fracture: Secondary | ICD-10-CM | POA: Diagnosis not present

## 2020-12-19 DIAGNOSIS — E785 Hyperlipidemia, unspecified: Secondary | ICD-10-CM | POA: Diagnosis not present

## 2020-12-19 DIAGNOSIS — I10 Essential (primary) hypertension: Secondary | ICD-10-CM | POA: Diagnosis not present

## 2020-12-19 DIAGNOSIS — E1142 Type 2 diabetes mellitus with diabetic polyneuropathy: Secondary | ICD-10-CM | POA: Diagnosis not present

## 2021-02-24 ENCOUNTER — Ambulatory Visit: Payer: Medicare HMO | Admitting: Family

## 2021-03-04 ENCOUNTER — Other Ambulatory Visit: Payer: Self-pay

## 2021-03-04 ENCOUNTER — Ambulatory Visit (INDEPENDENT_AMBULATORY_CARE_PROVIDER_SITE_OTHER): Payer: Medicare HMO | Admitting: Family

## 2021-03-04 VITALS — BP 135/50 | HR 63 | Temp 98.2°F | Resp 16 | Ht <= 58 in | Wt 111.6 lb

## 2021-03-04 DIAGNOSIS — E785 Hyperlipidemia, unspecified: Secondary | ICD-10-CM

## 2021-03-04 DIAGNOSIS — R634 Abnormal weight loss: Secondary | ICD-10-CM

## 2021-03-04 DIAGNOSIS — I10 Essential (primary) hypertension: Secondary | ICD-10-CM | POA: Diagnosis not present

## 2021-03-04 DIAGNOSIS — E119 Type 2 diabetes mellitus without complications: Secondary | ICD-10-CM

## 2021-03-04 DIAGNOSIS — M810 Age-related osteoporosis without current pathological fracture: Secondary | ICD-10-CM | POA: Diagnosis not present

## 2021-03-04 LAB — CBC WITH DIFFERENTIAL/PLATELET
Basophils Absolute: 0.1 10*3/uL (ref 0.0–0.1)
Basophils Relative: 0.6 % (ref 0.0–3.0)
Eosinophils Absolute: 0.2 10*3/uL (ref 0.0–0.7)
Eosinophils Relative: 1.6 % (ref 0.0–5.0)
HCT: 41.9 % (ref 36.0–46.0)
Hemoglobin: 13.6 g/dL (ref 12.0–15.0)
Lymphocytes Relative: 33.1 % (ref 12.0–46.0)
Lymphs Abs: 3.1 10*3/uL (ref 0.7–4.0)
MCHC: 32.4 g/dL (ref 30.0–36.0)
MCV: 91.5 fl (ref 78.0–100.0)
Monocytes Absolute: 0.6 10*3/uL (ref 0.1–1.0)
Monocytes Relative: 6 % (ref 3.0–12.0)
Neutro Abs: 5.5 10*3/uL (ref 1.4–7.7)
Neutrophils Relative %: 58.7 % (ref 43.0–77.0)
Platelets: 253 10*3/uL (ref 150.0–400.0)
RBC: 4.58 Mil/uL (ref 3.87–5.11)
RDW: 13.1 % (ref 11.5–15.5)
WBC: 9.3 10*3/uL (ref 4.0–10.5)

## 2021-03-04 LAB — COMPREHENSIVE METABOLIC PANEL
ALT: 13 U/L (ref 0–35)
AST: 18 U/L (ref 0–37)
Albumin: 4.7 g/dL (ref 3.5–5.2)
Alkaline Phosphatase: 56 U/L (ref 39–117)
BUN: 13 mg/dL (ref 6–23)
CO2: 26 mEq/L (ref 19–32)
Calcium: 9.8 mg/dL (ref 8.4–10.5)
Chloride: 104 mEq/L (ref 96–112)
Creatinine, Ser: 0.62 mg/dL (ref 0.40–1.20)
GFR: 89.14 mL/min (ref >60.00)
Glucose, Bld: 101 mg/dL — ABNORMAL HIGH (ref 70–99)
Potassium: 4.3 mEq/L (ref 3.5–5.1)
Sodium: 141 mEq/L (ref 135–145)
Total Bilirubin: 0.9 mg/dL (ref 0.2–1.2)
Total Protein: 7.9 g/dL (ref 6.0–8.3)

## 2021-03-04 LAB — MICROALBUMIN / CREATININE URINE RATIO
Creatinine,U: 31.4 mg/dL
Microalb Creat Ratio: 2.2 mg/g (ref 0.0–30.0)
Microalb, Ur: <0.7 mg/dL (ref 0.0–1.9)

## 2021-03-04 LAB — HEMOGLOBIN A1C: Hgb A1c MFr Bld: 6.2 % (ref 4.6–6.5)

## 2021-03-04 LAB — TSH: TSH: 3.21 u[IU]/mL (ref 0.35–5.50)

## 2021-03-04 MED ORDER — AMLODIPINE BESYLATE 5 MG PO TABS: 5.0000 mg | ORAL_TABLET | Freq: Every day | ORAL | 1 refills | Status: DC

## 2021-03-04 MED ORDER — METFORMIN HCL 500 MG PO TABS: ORAL_TABLET | ORAL | 1 refills | Status: DC

## 2021-03-04 MED ORDER — SHINGRIX 50 MCG/0.5ML IM SUSR: INTRAMUSCULAR | 1 refills | Status: DC

## 2021-03-04 MED ORDER — ALENDRONATE SODIUM 70 MG PO TABS: 70.0000 mg | ORAL_TABLET | ORAL | 11 refills | Status: DC

## 2021-03-04 MED ORDER — ATORVASTATIN CALCIUM 10 MG PO TABS: ORAL_TABLET | ORAL | 1 refills | Status: DC

## 2021-03-04 MED ORDER — BLOOD GLUCOSE MONITOR KIT: PACK | 0 refills | Status: DC

## 2021-03-04 NOTE — Assessment & Plan Note (Signed)
Lab Results  Component Value Date   HGBA1C 6.3 10/01/2020   HGBA1C 7.6 (H) 12/19/2019   HGBA1C 6.1 07/13/2016   Lab Results  Component Value Date   MICROALBUR <0.7 01/07/2020   LDLCALC 82 10/01/2020   CREATININE 0.65 10/01/2020   Will obtain follow up A1C. Clinically stable. Continue metformin '500mg'$  bid.

## 2021-03-04 NOTE — Assessment & Plan Note (Signed)
Lab Results  Component Value Date   CHOL 169 10/01/2020   HDL 49.50 10/01/2020   LDLCALC 82 10/01/2020   LDLDIRECT 98.0 12/19/2019   TRIG 184.0 (H) 10/01/2020   CHOLHDL 3 10/01/2020  LDL at goal. Continue atorvastatin '10mg'$ .

## 2021-03-04 NOTE — Assessment & Plan Note (Signed)
BP Readings from Last 3 Encounters:  03/04/21 (!) 135/50  10/01/20 (!) 152/65  05/27/20 136/69   Stable continue amlodipine.

## 2021-03-04 NOTE — Assessment & Plan Note (Addendum)
Wt Readings from Last 3 Encounters:  03/04/21 111 lb 9.6 oz (50.6 kg)  10/01/20 117 lb 3.2 oz (53.2 kg)  05/27/20 115 lb (52.2 kg)   New. Will check labs as ordered. I suspect that this is related to her increased activity working outside in the garden. Follow up in 1 month for weight recheck.

## 2021-03-04 NOTE — Patient Instructions (Addendum)
Please call to schedule an eye exam [phone] Please complete lab work prior to leaving.

## 2021-03-04 NOTE — Assessment & Plan Note (Signed)
Continue fosamax '70mg'$  once weekly.

## 2021-03-04 NOTE — Progress Notes (Signed)
Subjective:   By signing my name below, I, Zite Okoli, attest that this documentation has been prepared under the direction and in the presence of Debbrah Alar, NP 03/04/2021    Patient ID: Hailey Rasmussen, female    DOB: May 21, 1951, 70 y.o.   MRN: [id number]  Chief Complaint  Patient presents with   Hypertension    Here for follow up   Weight Loss    Patient loosing weight    HPI Patient is in today for an office visit. She is accompanied by her husband and is using a Status Video interpreter with ID number [id number].  Diet- She reports there has been no changes in her diet but has noticed a significant weight loss and is worried about it. Wt Readings from Last 3 Encounters:  03/04/21 111 lb 9.6 oz (50.6 kg)  10/01/20 117 lb 3.2 oz (53.2 kg)  05/27/20 115 lb (52.2 kg)    She mentions being more active and working in her garden. She was told to start drinking red beet, carrot and orange juice which she says has been helping to reduce her dizziness and lightheadedness. However, she mentions feeling "heavy" in her face. She denies sinus pain or congestion.   Osteoporosis- She has been managing her osteoporosis with 70 mg fosamax PO daily. She is requesting a refill on the medication and mentions she has been taking OTC calcium pills.  Phlegm- She reports that when she wakes up in the morning, she has significant phlegm in her throat. She denies itchy eyes,allergies,redness, acid reflux and congestion.  Hypertension- She has been managing her blood pressure with 5 mg amlodipine and doing well on it. BP Readings from Last 3 Encounters:  03/04/21 (!) 135/50  10/01/20 (!) 152/65  05/27/20 136/69     Diabetes Mellitus- She does not check her blood sugar levels consistently. She believes that the Glucometer she has does not function anymore and would like a new meter.  Immunizations- She has 3 Pfizer Covid-19 vaccines at this time. She is interested in getting the shingles  vaccines.  Vision- She is not UTD on vision care and will need to make an appointment soon. Her last appointment was January 14, 2020.  Past Medical History:  Diagnosis Date   Diabetes mellitus without complication ([place])    Fatty liver    Hyperlipidemia    Hypertension    Osteoporosis 07/16/2016   Ventral hernia     Past Surgical History:  Procedure Laterality Date   COLONOSCOPY      Family History  Problem Relation Age of Onset   Heart disease Father    Colon cancer Neg Hx    Esophageal cancer Neg Hx    Rectal cancer Neg Hx    Stomach cancer Neg Hx     Social History   Socioeconomic History   Marital status: Married    Spouse name: Not on file   Number of children: Not on file   Years of education: Not on file   Highest education level: Not on file  Occupational History   Not on file  Tobacco Use   Smoking status: Never   Smokeless tobacco: Never  Vaping Use   Vaping Use: Never used  Substance and Sexual Activity   Alcohol use: No    Alcohol/week: 0.0 standard drinks   Drug use: Never   Sexual activity: Not on file  Other Topics Concern   Not on file  Social History Narrative   Lives with her  husband and son   She has 7 children (one daughter died at age 70) 25 children all live in [place]   Retired homemaker   She completed high school   Moved to [place] from [place] in [place] Strain: Not on [employer] Insecurity: Not on file  Transportation Needs: Not on file  Physical Activity: Not on file  Stress: Not on file  Social Connections: Not on file  Intimate Partner Violence: Not on file    Outpatient Medications Prior to Visit  Medication Sig Dispense Refill   Calcium Carbonate-Vitamin D (CALTRATE 600+D) 600-400 MG-UNIT tablet Take 1 tablet by mouth 2 (two) times daily.     Lancets (ONETOUCH DELICA PLUS [id number]) MISC USE UP TO FOUR TIMES DAILY 100 each 3   ONETOUCH VERIO test strip USE UP TO FOUR TIMES  DAILY 300 strip 2   alendronate (FOSAMAX) 70 MG tablet Take 1 tablet (70 mg total) by mouth every 7 (seven) days. Take with a full glass of water on an empty stomach. 4 tablet 11   amLODipine (NORVASC) 5 MG tablet Take 1 tablet (5 mg total) by mouth daily. 90 tablet 1   atorvastatin (LIPITOR) 10 MG tablet TAKE 1 TABLET(10 MG) BY MOUTH DAILY 90 tablet 1   blood glucose meter kit and supplies KIT Dispense based on patient and insurance preference. Use up to four times daily as directed. (FOR ICD-9 250.00, 250.01). 1 each 0   metFORMIN (GLUCOPHAGE) 500 MG tablet TAKE 1 TABLET(500 MG) BY MOUTH TWICE DAILY WITH A MEAL 180 tablet 1   Facility-Administered Medications Prior to Visit  Medication Dose Route Frequency Provider Last Rate Last Admin   0.9 %  sodium chloride infusion  500 mL Intravenous Once Mansouraty, Telford Nab., MD        No Known Allergies  Review of Systems  Constitutional:  Positive for weight loss.  HENT:  Negative for congestion and sinus pain.   Eyes:  Negative for redness.  Endo/Heme/Allergies:  Negative for environmental allergies.      Objective:    Physical Exam Constitutional:      General: She is not in acute distress.    Appearance: Normal appearance. She is not ill-appearing.  HENT:     Head: Normocephalic and atraumatic.     Right Ear: External ear normal.     Left Ear: External ear normal.  Eyes:     Extraocular Movements: Extraocular movements intact.     Pupils: Pupils are equal, round, and reactive to light.  Cardiovascular:     Rate and Rhythm: Normal rate and regular rhythm.     Pulses: Normal pulses.     Heart sounds: Normal heart sounds. No murmur heard. Pulmonary:     Effort: Pulmonary effort is normal. No respiratory distress.     Breath sounds: Normal breath sounds. No wheezing or rhonchi.  Abdominal:     General: Bowel sounds are normal. There is no distension.     Palpations: Abdomen is soft.     Tenderness: There is no abdominal  tenderness. There is no guarding or rebound.  Musculoskeletal:     Cervical back: Neck supple.  Feet:     Comments: Diabetic Foot Exam - Simple   No data filed    Lymphadenopathy:     Cervical: No cervical adenopathy.  Skin:    General: Skin is warm and dry.  Neurological:  Mental Status: She is alert and oriented to person, place, and time.  Psychiatric:        Behavior: Behavior normal.        Judgment: Judgment normal.    BP (!) 135/50 (BP Location: Right Arm, Patient Position: Sitting, Cuff Size: Small)   Pulse 63   Temp 98.2 F (36.8 C) (Oral)   Resp 16   Ht $R'4\' 10"'fp$  (1.473 m)   Wt 111 lb 9.6 oz (50.6 kg)   LMP 07/27/1995   SpO2 100%   BMI 23.32 kg/m  Wt Readings from Last 3 Encounters:  03/04/21 111 lb 9.6 oz (50.6 kg)  10/01/20 117 lb 3.2 oz (53.2 kg)  05/27/20 115 lb (52.2 kg)    Diabetic Foot Exam - Simple   No data filed    Lab Results  Component Value Date   WBC 9.3 03/04/2021   HGB 13.6 03/04/2021   HCT 41.9 03/04/2021   PLT 253.0 03/04/2021   GLUCOSE 101 (H) 03/04/2021   CHOL 169 10/01/2020   TRIG 184.0 (H) 10/01/2020   HDL 49.50 10/01/2020   LDLDIRECT 98.0 12/19/2019   LDLCALC 82 10/01/2020   ALT 13 03/04/2021   AST 18 03/04/2021   NA 141 03/04/2021   K 4.3 03/04/2021   CL 104 03/04/2021   CREATININE 0.62 03/04/2021   BUN 13 03/04/2021   CO2 26 03/04/2021   TSH 3.21 03/04/2021   HGBA1C 6.2 03/04/2021   MICROALBUR <0.7 03/04/2021    Lab Results  Component Value Date   TSH 3.21 03/04/2021   Lab Results  Component Value Date   WBC 9.3 03/04/2021   HGB 13.6 03/04/2021   HCT 41.9 03/04/2021   MCV 91.5 03/04/2021   PLT 253.0 03/04/2021   Lab Results  Component Value Date   NA 141 03/04/2021   K 4.3 03/04/2021   CO2 26 03/04/2021   GLUCOSE 101 (H) 03/04/2021   BUN 13 03/04/2021   CREATININE 0.62 03/04/2021   BILITOT 0.9 03/04/2021   ALKPHOS 56 03/04/2021   AST 18 03/04/2021   ALT 13 03/04/2021   PROT 7.9 03/04/2021    ALBUMIN 4.7 03/04/2021   CALCIUM 9.8 03/04/2021   GFR 89.14 03/04/2021   Lab Results  Component Value Date   CHOL 169 10/01/2020   Lab Results  Component Value Date   HDL 49.50 10/01/2020   Lab Results  Component Value Date   LDLCALC 82 10/01/2020   Lab Results  Component Value Date   TRIG 184.0 (H) 10/01/2020   Lab Results  Component Value Date   CHOLHDL 3 10/01/2020   Lab Results  Component Value Date   HGBA1C 6.2 03/04/2021       Assessment & Plan:   Problem List Items Addressed This Visit       Unprioritized   Weight loss - Primary    Wt Readings from Last 3 Encounters:  03/04/21 111 lb 9.6 oz (50.6 kg)  10/01/20 117 lb 3.2 oz (53.2 kg)  05/27/20 115 lb (52.2 kg)  New. Will check labs as ordered. Follow up in 1 month for weight recheck.       Relevant Orders   Comp Met (CMET) (Completed)   CBC with Differential/Platelet (Completed)   TSH (Completed)   Osteoporosis    Continue fosamax $RemoveBeforeDE'70mg'BLcXbOdMaTxCTbh$  once weekly.        Relevant Medications   alendronate (FOSAMAX) 70 MG tablet   Hypertension    BP Readings from Last 3 Encounters:  03/04/21 Marland Kitchen)  135/50  10/01/20 (!) 152/65  05/27/20 136/69  Stable continue amlodipine.        Relevant Medications   amLODipine (NORVASC) 5 MG tablet   atorvastatin (LIPITOR) 10 MG tablet   Hyperlipidemia    Lab Results  Component Value Date   CHOL 169 10/01/2020   HDL 49.50 10/01/2020   LDLCALC 82 10/01/2020   LDLDIRECT 98.0 12/19/2019   TRIG 184.0 (H) 10/01/2020   CHOLHDL 3 10/01/2020  LDL at goal. Continue atorvastatin 10mg .        Relevant Medications   amLODipine (NORVASC) 5 MG tablet   atorvastatin (LIPITOR) 10 MG tablet   Controlled type 2 diabetes mellitus without complication, without long-term current use of insulin (HCC)    Lab Results  Component Value Date   HGBA1C 6.3 10/01/2020   HGBA1C 7.6 (H) 12/19/2019   HGBA1C 6.1 07/13/2016   Lab Results  Component Value Date   MICROALBUR <0.7 01/07/2020    [place] 82 10/01/2020   CREATININE 0.65 10/01/2020  Will obtain follow up A1C. Clinically stable. Continue metformin 500mg  bid.       Relevant Medications   blood glucose meter kit and supplies KIT   atorvastatin (LIPITOR) 10 MG tablet   metFORMIN (GLUCOPHAGE) 500 MG tablet   Other Relevant Orders   Hemoglobin A1c (Completed)   Urine Microalbumin w/creat. ratio (Completed)    Meds ordered this encounter  Medications   blood glucose meter kit and supplies KIT    Sig: Dispense based on patient and insurance preference. Use up to four times daily as directed.    Dispense:  1 each    Refill:  0    Order Specific Question:   Supervising Provider    Answer:   Mosie Lukes [id number]    Order Specific Question:   Number of strips    Answer:   100    Order Specific Question:   Number of lancets    Answer:   100   Zoster Vaccine Adjuvanted [facility]) injection    Sig: Inject 0.5mg  IM now and repeat in 2-6 months.    Dispense:  0.5 mL    Refill:  1    Order Specific Question:   Supervising Provider    Answer:   Penni Homans A [[id number]]   alendronate (FOSAMAX) 70 MG tablet    Sig: Take 1 tablet (70 mg total) by mouth every 7 (seven) days. Take with a full glass of water on an empty stomach.    Dispense:  4 tablet    Refill:  11    Order Specific Question:   Supervising Provider    Answer:   Penni Homans A [[id number]]   amLODipine (NORVASC) 5 MG tablet    Sig: Take 1 tablet (5 mg total) by mouth daily.    Dispense:  90 tablet    Refill:  1    Requested drug refills are authorized, however, the patient needs further evaluation and/or laboratory testing before further refills are given. Ask her to make an appointment for this.    Order Specific Question:   Supervising Provider    Answer:   Penni Homans A [[id number]]   atorvastatin (LIPITOR) 10 MG tablet    Sig: TAKE 1 TABLET(10 MG) BY MOUTH DAILY    Dispense:  90 tablet    Refill:  1    Order Specific Question:   Supervising Provider     Answer:   Penni Homans A [[id number]]   metFORMIN (GLUCOPHAGE) 500 MG  tablet    Sig: TAKE 1 TABLET(500 MG) BY MOUTH TWICE DAILY WITH A MEAL    Dispense:  180 tablet    Refill:  1    Order Specific Question:   Supervising Provider    Answer:   Penni Homans A [[id number]]    I,Zite Okoli,acting as a scribe for Nance Pear, NP.,have documented all relevant documentation on the behalf of Nance Pear, NP,as directed by  Nance Pear, NP while in the presence of Nance Pear, NP.   I, Debbrah Alar, NP , personally preformed the services described in this documentation.  All medical record entries made by the scribe were at my direction and in my presence.  I have reviewed the chart and discharge instructions (if applicable) and agree that the record reflects my personal performance and is accurate and complete. 03/04/2021

## 2021-03-06 ENCOUNTER — Encounter: Payer: Self-pay | Admitting: Family

## 2021-03-06 NOTE — Progress Notes (Signed)
Mailed out to patient 

## 2021-03-23 ENCOUNTER — Telehealth: Payer: Self-pay | Admitting: Family

## 2021-03-23 NOTE — Telephone Encounter (Signed)
Left message for patient to call back and schedule Medicare Annual Wellness Visit (AWV) either virtually or phone visit  Left  my jabber number [phone] and office number    Last AWVI 05/13/15  please schedule at anytime with  NHA  This should be a 40 minute visit.

## 2021-03-25 ENCOUNTER — Other Ambulatory Visit: Payer: Self-pay

## 2021-03-25 ENCOUNTER — Telehealth: Payer: Self-pay | Admitting: Family

## 2021-03-25 DIAGNOSIS — E119 Type 2 diabetes mellitus without complications: Secondary | ICD-10-CM

## 2021-03-25 MED ORDER — BLOOD GLUCOSE MONITOR KIT: PACK | 0 refills | Status: DC

## 2021-03-25 NOTE — Telephone Encounter (Signed)
Rx sent again

## 2021-03-25 NOTE — Telephone Encounter (Signed)
Son returned called stating insurance came to home and did awv

## 2021-03-25 NOTE — Telephone Encounter (Signed)
Patient's son calling to let us know that his mom did not received her blood glucose meter kit and supplies KIT . He stated that his dad took his mom to the [facility] pharmacy on [address] and they did not have it for them. He would like to know if it can be resent to the same pharmacy. Please advise.    [id number] - [place], [place] - [address] AT [facility] [address]  [address], [place] [address]  Phone:  [phone]  Fax:  [phone]

## 2021-04-01 ENCOUNTER — Ambulatory Visit: Payer: Medicare HMO | Admitting: Family

## 2021-04-01 ENCOUNTER — Other Ambulatory Visit: Payer: Self-pay

## 2021-04-01 ENCOUNTER — Ambulatory Visit: Payer: Medicare HMO

## 2021-04-01 DIAGNOSIS — E119 Type 2 diabetes mellitus without complications: Secondary | ICD-10-CM

## 2021-04-01 MED ORDER — BLOOD GLUCOSE MONITOR KIT: PACK | 0 refills | Status: DC

## 2021-04-01 NOTE — Progress Notes (Signed)
Patient here to day for weight loss follow up, patient weight was  111 lb on 8/04-2021 112 lb today  Per Provider ok to continue to monitor. Follow up in 3 months or sooner if needed. Patient was scheduled to return in December.

## 2021-04-01 NOTE — Progress Notes (Incomplete)
Subjective:   By signing my name below, I, [name], attest that this documentation has been prepared under the direction and in the presence of Sandford Craze NP. 04/01/2021    Patient ID: Hailey Rasmussen, female    DOB: 1951/02/07, 70 y.o.   MRN: [id number]  No chief complaint on file.   HPI Patient is in today for a office visit.   Health Maintenance Due  Topic Date Due   Zoster Vaccines- Shingrix (1 of 2) Never done   COVID-19 Vaccine (3 - Booster) 04/04/2020   FOOT EXAM  01/06/2021   OPHTHALMOLOGY EXAM  01/20/2021   INFLUENZA VACCINE  02/23/2021    Past Medical History:  Diagnosis Date   Diabetes mellitus without complication (HCC)    Fatty liver    Hyperlipidemia    Hypertension    Osteoporosis 07/16/2016   Ventral hernia     Past Surgical History:  Procedure Laterality Date   COLONOSCOPY      Family History  Problem Relation Age of Onset   Heart disease Father    Colon cancer Neg Hx    Esophageal cancer Neg Hx    Rectal cancer Neg Hx    Stomach cancer Neg Hx     Social History   Socioeconomic History   Marital status: Married    Spouse name: Not on file   Number of children: Not on file   Years of education: Not on file   Highest education level: Not on file  Occupational History   Not on file  Tobacco Use   Smoking status: Never   Smokeless tobacco: Never  Vaping Use   Vaping Use: Never used  Substance and Sexual Activity   Alcohol use: No    Alcohol/week: 0.0 standard drinks   Drug use: Never   Sexual activity: Not on file  Other Topics Concern   Not on file  Social History Narrative   Lives with her husband and son   She has 7 children (one daughter died at age 3) 6 children all live in [place]   Retired homemaker   She completed high school   Moved to [place] from [place] in 1997      Social Determinants of Health   Financial Resource Strain: Not on file  Food Insecurity: Not on file  Transportation Needs: Not on file   Physical Activity: Not on file  Stress: Not on file  Social Connections: Not on file  Intimate Partner Violence: Not on file    Outpatient Medications Prior to Visit  Medication Sig Dispense Refill   alendronate (FOSAMAX) 70 MG tablet Take 1 tablet (70 mg total) by mouth every 7 (seven) days. Take with a full glass of water on an empty stomach. 4 tablet 11   amLODipine (NORVASC) 5 MG tablet Take 1 tablet (5 mg total) by mouth daily. 90 tablet 1   atorvastatin (LIPITOR) 10 MG tablet TAKE 1 TABLET(10 MG) BY MOUTH DAILY 90 tablet 1   blood glucose meter kit and supplies KIT Dispense based on patient and insurance preference. Use up to four times daily as directed. 1 each 0   Calcium Carbonate-Vitamin D (CALTRATE 600+D) 600-400 MG-UNIT tablet Take 1 tablet by mouth 2 (two) times daily.     Lancets (ONETOUCH DELICA PLUS [id number]) MISC USE UP TO FOUR TIMES DAILY 100 each 3   metFORMIN (GLUCOPHAGE) 500 MG tablet TAKE 1 TABLET(500 MG) BY MOUTH TWICE DAILY WITH A MEAL 180 tablet 1   ONETOUCH  VERIO test strip USE UP TO FOUR TIMES DAILY 300 strip 2   Zoster Vaccine Adjuvanted [facility]) injection Inject 0.5mg  IM now and repeat in 2-6 months. 0.5 mL 1   Facility-Administered Medications Prior to Visit  Medication Dose Route Frequency Provider Last Rate Last Admin   0.9 %  sodium chloride infusion  500 mL Intravenous Once Mansouraty, Telford Nab., MD        No Known Allergies  ROS     Objective:    Physical Exam Constitutional:      General: She is not in acute distress.    Appearance: Normal appearance. She is not ill-appearing.  HENT:     Head: Normocephalic and atraumatic.     Right Ear: External ear normal.     Left Ear: External ear normal.  Eyes:     Extraocular Movements: Extraocular movements intact.     Pupils: Pupils are equal, round, and reactive to light.  Cardiovascular:     Rate and Rhythm: Normal rate and regular rhythm.     Heart sounds: Normal heart sounds. No murmur  heard.   No gallop.  Pulmonary:     Effort: Pulmonary effort is normal. No respiratory distress.     Breath sounds: Normal breath sounds. No wheezing or rales.  Skin:    General: Skin is warm and dry.  Neurological:     Mental Status: She is alert and oriented to person, place, and time.  Psychiatric:        Behavior: Behavior normal.    LMP 07/27/1995  Wt Readings from Last 3 Encounters:  03/04/21 111 lb 9.6 oz (50.6 kg)  10/01/20 117 lb 3.2 oz (53.2 kg)  05/27/20 115 lb (52.2 kg)       Assessment & Plan:   Problem List Items Addressed This Visit   None    No orders of the defined types were placed in this encounter.   I, Debbrah Alar NP, personally preformed the services described in this documentation.  All medical record entries made by the scribe were at my direction and in my presence.  I have reviewed the chart and discharge instructions (if applicable) and agree that the record reflects my personal performance and is accurate and complete. 04/01/2021   I,[name],acting as a scribe for Nance Pear, NP.,have documented all relevant documentation on the behalf of Nance Pear, NP,as directed by  Nance Pear, NP while in the presence of Nance Pear, NP.   Shehryar [employer]

## 2021-04-07 ENCOUNTER — Other Ambulatory Visit: Payer: Self-pay | Admitting: Family

## 2021-04-09 ENCOUNTER — Other Ambulatory Visit: Payer: Self-pay

## 2021-04-09 ENCOUNTER — Other Ambulatory Visit: Payer: Self-pay | Admitting: Family

## 2021-04-09 MED ORDER — ONETOUCH DELICA PLUS LANCET30G MISC: 3 refills | Status: DC

## 2021-04-09 MED ORDER — ONETOUCH VERIO VI STRP: ORAL_STRIP | 2 refills | Status: DC

## 2021-04-10 ENCOUNTER — Other Ambulatory Visit: Payer: Self-pay | Admitting: Family

## 2021-04-15 ENCOUNTER — Other Ambulatory Visit: Payer: Self-pay

## 2021-04-15 DIAGNOSIS — E119 Type 2 diabetes mellitus without complications: Secondary | ICD-10-CM

## 2021-04-15 MED ORDER — ONETOUCH DELICA PLUS LANCET30G MISC: 3 refills | Status: DC

## 2021-04-29 ENCOUNTER — Telehealth: Payer: Self-pay | Admitting: *Deleted

## 2021-04-29 MED ORDER — ONETOUCH VERIO VI STRP: ORAL_STRIP | 2 refills | Status: DC

## 2021-04-29 NOTE — Telephone Encounter (Signed)
Pharmacy sent over fax stating in covers test strips for a max of 3 times daily.  New rx sent in.

## 2021-05-03 ENCOUNTER — Other Ambulatory Visit: Payer: Self-pay | Admitting: Family

## 2021-05-05 ENCOUNTER — Other Ambulatory Visit: Payer: Self-pay | Admitting: *Deleted

## 2021-05-05 MED ORDER — ONETOUCH VERIO VI STRP: ORAL_STRIP | 2 refills | Status: DC

## 2021-06-30 ENCOUNTER — Ambulatory Visit: Payer: Medicare HMO | Admitting: Family

## 2021-08-03 ENCOUNTER — Ambulatory Visit: Payer: Medicare HMO | Admitting: Family

## 2021-08-12 ENCOUNTER — Ambulatory Visit (INDEPENDENT_AMBULATORY_CARE_PROVIDER_SITE_OTHER): Payer: Medicare HMO | Admitting: Family

## 2021-08-12 DIAGNOSIS — R053 Chronic cough: Secondary | ICD-10-CM | POA: Diagnosis not present

## 2021-08-12 DIAGNOSIS — E119 Type 2 diabetes mellitus without complications: Secondary | ICD-10-CM | POA: Diagnosis not present

## 2021-08-12 DIAGNOSIS — E785 Hyperlipidemia, unspecified: Secondary | ICD-10-CM | POA: Diagnosis not present

## 2021-08-12 MED ORDER — BLOOD GLUCOSE MONITOR KIT: PACK | 0 refills | Status: DC

## 2021-08-12 MED ORDER — AMLODIPINE BESYLATE 5 MG PO TABS: ORAL_TABLET | ORAL | 1 refills | Status: DC

## 2021-08-12 MED ORDER — METFORMIN HCL 500 MG PO TABS: 500.0000 mg | ORAL_TABLET | Freq: Two times a day (BID) | ORAL | 1 refills | Status: DC

## 2021-08-12 MED ORDER — ATORVASTATIN CALCIUM 10 MG PO TABS: 10.0000 mg | ORAL_TABLET | Freq: Every day | ORAL | 1 refills | Status: DC

## 2021-08-12 NOTE — Assessment & Plan Note (Signed)
LDL at goal, continue lipitor 10mg .

## 2021-08-12 NOTE — Patient Instructions (Addendum)
Please get the Shingrix shot (Shingles) and new Herbalist at [facility].   Complete lab work prior to leaving.

## 2021-08-12 NOTE — Assessment & Plan Note (Signed)
Improved. Monitor.  

## 2021-08-12 NOTE — Assessment & Plan Note (Addendum)
Lab Results  Component Value Date   HGBA1C 6.2 03/04/2021   HGBA1C 6.3 10/01/2020   HGBA1C 7.6 (H) 12/19/2019   Lab Results  Component Value Date   MICROALBUR <0.7 03/04/2021   LDLCALC 82 10/01/2020   CREATININE 0.62 03/04/2021   Check A1C, continue metformin and DM diet. Refer for DM eye exam.

## 2021-08-12 NOTE — Progress Notes (Signed)
Subjective:   By signing my name below, I, [name], attest that this documentation has been prepared under the direction and in the presence of Debbrah Alar NP. 08/12/2021     Patient ID: Hailey Rasmussen, female    DOB: [birth year], 71 y.o.   MRN: [id number]  Chief Complaint  Patient presents with   Diabetes    Here for follow up   Watery eye    Complains of watery, tearing from right eye    HPI Patient is in today for a office visit. She is present with her daughter and an interpreter during this visit.  Blood sugar- She reports her blood glucose measuring device has not worked for the past couple of months and she has not checked her blood sugars since.  Lab Results  Component Value Date   HGBA1C 6.2 03/04/2021   Cough- Her cough has improved since last visit but she has mucous building up in her throat which makes her feel stuffy.  Vision- She is due for vision care. She was told during her last visit to put an order for glasses but did not place one. Her daughter is requesting to make another appointment for her eye care.  Immunizations- She received her flu vaccine this year on October, 2022 at her pharmacy. Her daughter reports she has 3 Covid-19 vaccines at this time. She was informed of the bivalent Covid-19 vaccine. She is eligible for the shingles vaccine and is interested in receiving it at her pharmacy.    Health Maintenance Due  Topic Date Due   Zoster Vaccines- Shingrix (1 of 2) Never done   COVID-19 Vaccine (5 - Booster) 12/29/2019   OPHTHALMOLOGY EXAM  01/20/2021    Past Medical History:  Diagnosis Date   Diabetes mellitus without complication ([place])    Fatty liver    Hyperlipidemia    Hypertension    Osteoporosis 07/16/2016   Ventral hernia     Past Surgical History:  Procedure Laterality Date   COLONOSCOPY      Family History  Problem Relation Age of Onset   Heart disease Father    Colon cancer Neg Hx    Esophageal cancer Neg Hx     Rectal cancer Neg Hx    Stomach cancer Neg Hx     Social History   Socioeconomic History   Marital status: Married    Spouse name: Not on file   Number of children: Not on file   Years of education: Not on file   Highest education level: Not on file  Occupational History   Not on file  Tobacco Use   Smoking status: Never   Smokeless tobacco: Never  Vaping Use   Vaping Use: Never used  Substance and Sexual Activity   Alcohol use: No    Alcohol/week: 0.0 standard drinks   Drug use: Never   Sexual activity: Not on file  Other Topics Concern   Not on file  Social History Narrative   Lives with her husband and son   She has 7 children (one daughter died at age 29) 3 children all live in [place]   Retired homemaker   She completed high school   Moved to [place] from [place] in 1997      Social Determinants of Health   Financial Resource Strain: Not on file  Food Insecurity: Not on file  Transportation Needs: Not on file  Physical Activity: Not on file  Stress: Not on file  Social Connections: Not  on file  Intimate Partner Violence: Not on file    Outpatient Medications Prior to Visit  Medication Sig Dispense Refill   alendronate (FOSAMAX) 70 MG tablet Take 1 tablet (70 mg total) by mouth every 7 (seven) days. Take with a full glass of water on an empty stomach. 4 tablet 11   Calcium Carbonate-Vitamin D (CALTRATE 600+D) 600-400 MG-UNIT tablet Take 1 tablet by mouth 2 (two) times daily.     glucose blood (ONETOUCH VERIO) test strip USE UP TO 3 TIMES DAILY.  Dx code: E11.9 300 strip 2   Lancets (ONETOUCH DELICA PLUS [id number]) MISC USE UP TO FOUR TIMES DAILY 100 each 3   Zoster Vaccine Adjuvanted [facility]) injection Inject 0.60m IM now and repeat in 2-6 months. 0.5 mL 1   amLODipine (NORVASC) 5 MG tablet TAKE 1 TABLET(5 MG) BY MOUTH DAILY 90 tablet 1   atorvastatin (LIPITOR) 10 MG tablet TAKE 1 TABLET(10 MG) BY MOUTH DAILY 90 tablet 1   blood glucose meter kit and supplies KIT  Dispense based on patient and insurance preference. Use up to four times daily as directed. 1 each 0   metFORMIN (GLUCOPHAGE) 500 MG tablet TAKE 1 TABLET(500 MG) BY MOUTH TWICE DAILY WITH A MEAL 180 tablet 1   Facility-Administered Medications Prior to Visit  Medication Dose Route Frequency Provider Last Rate Last Admin   0.9 %  sodium chloride infusion  500 mL Intravenous Once Mansouraty, GTelford Nab, MD        No Known Allergies  Review of Systems  HENT:         (+)mucous building up in throat      Objective:    Physical Exam Constitutional:      General: She is not in acute distress.    Appearance: Normal appearance. She is not ill-appearing.  HENT:     Head: Normocephalic and atraumatic.     Right Ear: External ear normal.     Left Ear: External ear normal.  Eyes:     Extraocular Movements: Extraocular movements intact.     Pupils: Pupils are equal, round, and reactive to light.  Cardiovascular:     Rate and Rhythm: Normal rate and regular rhythm.     Pulses:          Dorsalis pedis pulses are 2+ on the right side and 2+ on the left side.       Posterior tibial pulses are 2+ on the right side and 2+ on the left side.     Heart sounds: Normal heart sounds. No murmur heard.   No gallop.  Pulmonary:     Effort: Pulmonary effort is normal. No respiratory distress.     Breath sounds: Normal breath sounds. No wheezing or rales.  Skin:    General: Skin is warm and dry.  Neurological:     Mental Status: She is alert and oriented to person, place, and time.  Psychiatric:        Behavior: Behavior normal.   Diabetic Foot Exam - Simple   Simple Foot Form Diabetic Foot exam was performed with the following findings: Yes 08/12/2021  1:30 PM  Visual Inspection No deformities, no ulcerations, no other skin breakdown bilaterally: Yes Sensation Testing Intact to touch and monofilament testing bilaterally: Yes Pulse Check Posterior Tibialis and Dorsalis pulse intact bilaterally:  Yes Comments     BP (!) 149/69 (BP Location: Right Arm, Patient Position: Sitting, Cuff Size: Small)    Pulse 76    Temp  99 F (37.2 C) (Oral)    Resp 16    Wt 119 lb (54 kg)    LMP 07/27/1995    SpO2 99%    BMI 24.87 kg/m  Wt Readings from Last 3 Encounters:  08/12/21 119 lb (54 kg)  03/04/21 111 lb 9.6 oz (50.6 kg)  10/01/20 117 lb 3.2 oz (53.2 kg)       Assessment & Plan:   Problem List Items Addressed This Visit       Unprioritized   Hyperlipidemia    LDL at goal, continue lipitor 41m.       Relevant Medications   amLODipine (NORVASC) 5 MG tablet   atorvastatin (LIPITOR) 10 MG tablet   Controlled type 2 diabetes mellitus without complication, without long-term current use of insulin (HCC)    Lab Results  Component Value Date   HGBA1C 6.2 03/04/2021   HGBA1C 6.3 10/01/2020   HGBA1C 7.6 (H) 12/19/2019   Lab Results  Component Value Date   MICROALBUR <0.7 03/04/2021   LL[place]82 10/01/2020   CREATININE 0.62 03/04/2021  Check A1C, continue metformin and DM diet. Refer for DM eye exam.       Relevant Medications   blood glucose meter kit and supplies KIT   atorvastatin (LIPITOR) 10 MG tablet   metFORMIN (GLUCOPHAGE) 500 MG tablet   Other Relevant Orders   Hemoglobin A1c   Ambulatory referral to Ophthalmology   Chronic cough    Improved.  Monitor.          Meds ordered this encounter  Medications   blood glucose meter kit and supplies KIT    Sig: Dispense based on patient and insurance preference. Use up to four times daily as directed.    Dispense:  1 each    Refill:  0    Order Specific Question:   Supervising Provider    Answer:   BPenni HomansA [[id number]   Order Specific Question:   Number of strips    Answer:   100    Order Specific Question:   Number of lancets    Answer:   100   amLODipine (NORVASC) 5 MG tablet    Sig: TAKE 1 TABLET(5 MG) BY MOUTH DAILY    Dispense:  90 tablet    Refill:  1    Order Specific Question:   Supervising  Provider    Answer:   BPenni HomansA [[id number]]   atorvastatin (LIPITOR) 10 MG tablet    Sig: Take 1 tablet (10 mg total) by mouth daily.    Dispense:  90 tablet    Refill:  1    Order Specific Question:   Supervising Provider    Answer:   BPenni HomansA [[id number]]   metFORMIN (GLUCOPHAGE) 500 MG tablet    Sig: Take 1 tablet (500 mg total) by mouth 2 (two) times daily with a meal.    Dispense:  180 tablet    Refill:  1    Order Specific Question:   Supervising Provider    Answer:   BPenni HomansA [[id number]]    I, MDebbrah AlarNP, personally preformed the services described in this documentation.  All medical record entries made by the scribe were at my direction and in my presence.  I have reviewed the chart and discharge instructions (if applicable) and agree that the record reflects my personal performance and is accurate and complete. 08/12/2021   I,[name],acting as a scribe for MNance Pear NP.,have  documented all relevant documentation on the behalf of Nance Pear, NP,as directed by  Nance Pear, NP while in the presence of Nance Pear, NP.   Nance Pear, NP

## 2021-10-13 DIAGNOSIS — H524 Presbyopia: Secondary | ICD-10-CM | POA: Diagnosis not present

## 2021-10-13 DIAGNOSIS — H25813 Combined forms of age-related cataract, bilateral: Secondary | ICD-10-CM | POA: Diagnosis not present

## 2021-10-13 DIAGNOSIS — H5213 Myopia, bilateral: Secondary | ICD-10-CM | POA: Diagnosis not present

## 2021-10-13 DIAGNOSIS — E119 Type 2 diabetes mellitus without complications: Secondary | ICD-10-CM | POA: Diagnosis not present

## 2021-10-13 DIAGNOSIS — H35371 Puckering of macula, right eye: Secondary | ICD-10-CM | POA: Diagnosis not present

## 2021-10-13 DIAGNOSIS — H5202 Hypermetropia, left eye: Secondary | ICD-10-CM | POA: Diagnosis not present

## 2021-10-13 DIAGNOSIS — H43811 Vitreous degeneration, right eye: Secondary | ICD-10-CM | POA: Diagnosis not present

## 2021-10-13 DIAGNOSIS — H52222 Regular astigmatism, left eye: Secondary | ICD-10-CM | POA: Diagnosis not present

## 2021-10-13 LAB — HM DIABETES EYE EXAM

## 2021-12-09 ENCOUNTER — Ambulatory Visit (INDEPENDENT_AMBULATORY_CARE_PROVIDER_SITE_OTHER): Payer: Medicare HMO | Admitting: Family

## 2021-12-09 VITALS — BP 125/57 | HR 69 | Temp 98.0°F | Resp 16 | Wt 123.2 lb

## 2021-12-09 DIAGNOSIS — I10 Essential (primary) hypertension: Secondary | ICD-10-CM | POA: Diagnosis not present

## 2021-12-09 DIAGNOSIS — E785 Hyperlipidemia, unspecified: Secondary | ICD-10-CM

## 2021-12-09 DIAGNOSIS — M810 Age-related osteoporosis without current pathological fracture: Secondary | ICD-10-CM

## 2021-12-09 DIAGNOSIS — E119 Type 2 diabetes mellitus without complications: Secondary | ICD-10-CM

## 2021-12-09 LAB — COMPREHENSIVE METABOLIC PANEL
ALT: 22 U/L (ref 0–35)
AST: 24 U/L (ref 0–37)
Albumin: 4.5 g/dL (ref 3.5–5.2)
Alkaline Phosphatase: 56 U/L (ref 39–117)
BUN: 15 mg/dL (ref 6–23)
CO2: 29 mEq/L (ref 19–32)
Calcium: 9.7 mg/dL (ref 8.4–10.5)
Chloride: 103 mEq/L (ref 96–112)
Creatinine, Ser: 0.73 mg/dL (ref 0.40–1.20)
GFR: 81.88 mL/min (ref >60.00)
Glucose, Bld: 171 mg/dL — ABNORMAL HIGH (ref 70–99)
Potassium: 4.3 mEq/L (ref 3.5–5.1)
Sodium: 141 mEq/L (ref 135–145)
Total Bilirubin: 0.7 mg/dL (ref 0.2–1.2)
Total Protein: 7.4 g/dL (ref 6.0–8.3)

## 2021-12-09 LAB — HEMOGLOBIN A1C: Hgb A1c MFr Bld: 6.9 % — ABNORMAL HIGH (ref 4.6–6.5)

## 2021-12-09 MED ORDER — ALENDRONATE SODIUM 70 MG PO TABS: 70.0000 mg | ORAL_TABLET | ORAL | 3 refills | Status: DC

## 2021-12-09 MED ORDER — BLOOD GLUCOSE TEST STRIPS 333 VI STRP: 1.0000 | ORAL_STRIP | Freq: Two times a day (BID) | 12 refills | Status: DC

## 2021-12-09 MED ORDER — ATORVASTATIN CALCIUM 10 MG PO TABS: 10.0000 mg | ORAL_TABLET | Freq: Every day | ORAL | 1 refills | Status: DC

## 2021-12-09 MED ORDER — ACCU-CHEK SOFTCLIX LANCETS MISC: 12 refills | Status: AC

## 2021-12-09 MED ORDER — ACCU-CHEK SOFTCLIX LANCETS MISC: 12 refills | Status: DC

## 2021-12-09 MED ORDER — AMLODIPINE BESYLATE 5 MG PO TABS: ORAL_TABLET | ORAL | 1 refills | Status: DC

## 2021-12-09 NOTE — Assessment & Plan Note (Signed)
Tolerating statin. LDL at goal. Continue lipitor 10 mg.  ?

## 2021-12-09 NOTE — Assessment & Plan Note (Signed)
BP stable.  Continue amlodipine. 

## 2021-12-09 NOTE — Progress Notes (Signed)
? ?Subjective:  ? ? ? Patient ID: Hailey Rasmussen, female    DOB: [birth year], 71 y.o.   MRN: [id number] ? ?Chief Complaint  ?Patient presents with  ? Diabetes  ?  Here for follow up  ? ? ?Diabetes ? ?Patient is in today for follow up of Hailey Rasmussen diabetes. Daughter reports that Hailey Rasmussen has not been taking metformin.  ? ?Lab Results  ?Component Value Date  ? HGBA1C 6.2 03/04/2021  ? HGBA1C 6.3 10/01/2020  ? HGBA1C 7.6 (H) 12/19/2019  ? ?Lab Results  ?Component Value Date  ? MICROALBUR <0.7 03/04/2021  ? [place] 82 10/01/2020  ? CREATININE 0.62 03/04/2021  ? ?HTN-  ?BP Readings from Last 3 Encounters:  ?12/09/21 (!) 125/57  ?08/12/21 (!) 149/69  ?03/04/21 (!) 135/50  ? ?Maintained on amlodipine 66m.  ? ?Osteoporosis- continues weekly fosamax. ? ?Hyperlipidemia-  ?Lab Results  ?Component Value Date  ? CHOL 169 10/01/2020  ? HDL 49.50 10/01/2020  ? L[place]82 10/01/2020  ? LDLDIRECT 98.0 12/19/2019  ? TRIG 184.0 (H) 10/01/2020  ? CHOLHDL 3 10/01/2020  ? ? ? ? ? ?Health Maintenance Due  ?Topic Date Due  ? Zoster Vaccines- Shingrix (1 of 2) Never done  ? COVID-19 Vaccine (5 - Booster) 12/29/2019  ? HEMOGLOBIN A1C  09/04/2021  ? ? ?Past Medical History:  ?Diagnosis Date  ? Diabetes mellitus without complication ([place]   ? Fatty liver   ? Hyperlipidemia   ? Hypertension   ? Osteoporosis 07/16/2016  ? Ventral hernia   ? ? ?Past Surgical History:  ?Procedure Laterality Date  ? COLONOSCOPY    ? ? ?Family History  ?Problem Relation Age of Onset  ? Heart disease Father   ? Colon cancer Neg Hx   ? Esophageal cancer Neg Hx   ? Rectal cancer Neg Hx   ? Stomach cancer Neg Hx   ? ? ?Social History  ? ?Socioeconomic History  ? Marital status: Married  ?  Spouse name: Not on file  ? Number of children: Not on file  ? Years of education: Not on file  ? Highest education level: Not on file  ?Occupational History  ? Not on file  ?Tobacco Use  ? Smoking status: Never  ? Smokeless tobacco: Never  ?Vaping Use  ? Vaping Use: Never used  ?Substance and  Sexual Activity  ? Alcohol use: No  ?  Alcohol/week: 0.0 standard drinks  ? Drug use: Never  ? Sexual activity: Not on file  ?Other Topics Concern  ? Not on file  ?Social History Narrative  ? Lives with Hailey Rasmussen husband and son  ? Hailey Rasmussen has 7 children (one daughter died at age 362 [place]children all live in [place] ? Retired homemaker  ? Hailey Rasmussen completed high school  ? [place]  ?   ? ?Social Determinants of Health  ? ?Financial Resource Strain: Not on file  ?Food Insecurity: Not on file  ?Transportation Needs: Not on file  ?Physical Activity: Not on file  ?Stress: Not on file  ?Social Connections: Not on file  ?Intimate Partner Violence: Not on file  ? ? ?Outpatient Medications Prior to Visit  ?Medication Sig Dispense Refill  ? blood glucose meter kit and supplies KIT Dispense based on patient and insurance preference. Use up to four times daily as directed. 1 each 0  ? Calcium Carbonate-Vitamin D (CALTRATE 600+D) 600-400 MG-UNIT tablet Take 1 tablet by mouth 2 (two) times daily.    ?  metFORMIN (GLUCOPHAGE) 500 MG tablet Take 1 tablet (500 mg total) by mouth 2 (two) times daily with a meal. 180 tablet 1  ? Zoster Vaccine Adjuvanted [facility]) injection Inject 0.68m IM now and repeat in 2-6 months. 0.5 mL 1  ? alendronate (FOSAMAX) 70 MG tablet Take 1 tablet (70 mg total) by mouth every 7 (seven) days. Take with a full glass of water on an empty stomach. 4 tablet 11  ? amLODipine (NORVASC) 5 MG tablet TAKE 1 TABLET(5 MG) BY MOUTH DAILY 90 tablet 1  ? atorvastatin (LIPITOR) 10 MG tablet Take 1 tablet (10 mg total) by mouth daily. 90 tablet 1  ? glucose blood (ONETOUCH VERIO) test strip USE UP TO 3 TIMES DAILY.  Dx code: E11.9 300 strip 2  ? Lancets (ONETOUCH DELICA PLUS [id number] MISC USE UP TO FOUR TIMES DAILY 100 each 3  ? ?Facility-Administered Medications Prior to Visit  ?Medication Dose Route Frequency Provider Last Rate Last Admin  ? 0.9 %  sodium chloride infusion  500 mL Intravenous Once Mansouraty,  GTelford Nab, MD      ? ? ?No Known Allergies ? ?ROS ? ?  See HPI ?Objective:  ?  ?Physical Exam ?Constitutional:   ?   General: Hailey Rasmussen is not in acute distress. ?   Appearance: Normal appearance. Hailey Rasmussen is well-developed.  ?HENT:  ?   Head: Normocephalic and atraumatic.  ?   Right Ear: External ear normal.  ?   Left Ear: External ear normal.  ?Eyes:  ?   General: No scleral icterus. ?Neck:  ?   Thyroid: No thyromegaly.  ?Cardiovascular:  ?   Rate and Rhythm: Normal rate and regular rhythm.  ?   Heart sounds: Normal heart sounds. No murmur heard. ?Pulmonary:  ?   Effort: Pulmonary effort is normal. No respiratory distress.  ?   Breath sounds: Normal breath sounds. No wheezing.  ?Musculoskeletal:  ?   Cervical back: Neck supple.  ?Skin: ?   General: Skin is warm and dry.  ?Neurological:  ?   Mental Status: Hailey Rasmussen is alert and oriented to person, place, and time.  ?Psychiatric:     ?   Mood and Affect: Mood normal.     ?   Behavior: Behavior normal.     ?   Thought Content: Thought content normal.     ?   Judgment: Judgment normal.  ? ? ?BP (!) 125/57 (BP Location: Right Arm, Patient Position: Sitting, Cuff Size: Small)   Pulse 69   Temp 98 ?F (36.7 ?C) (Oral)   Resp 16   Wt 123 lb 3.2 oz (55.9 kg)   LMP 07/27/1995   SpO2 97%   BMI 25.75 kg/m?  ?Wt Readings from Last 3 Encounters:  ?12/09/21 123 lb 3.2 oz (55.9 kg)  ?08/12/21 119 lb (54 kg)  ?03/04/21 111 lb 9.6 oz (50.6 kg)  ? ? ?   ?Assessment & Plan:  ? ?Problem List Items Addressed This Visit   ? ?  ? Unprioritized  ? Controlled type 2 diabetes mellitus without complication, without long-term current use of insulin ([place]  ? Relevant Medications  ? atorvastatin (LIPITOR) 10 MG tablet  ? Other Relevant Orders  ? Hemoglobin A1c  ? Comp Met (CMET)  ? ? ?I am having PIvy Lynnmaintain Hailey Rasmussen Calcium Carbonate-Vitamin D, Shingrix, blood glucose meter kit and supplies, metFORMIN, Accu-Chek Softclix Lancets, Blood Glucose Test Strips 333, amLODipine,  atorvastatin, and alendronate. We will continue to administer  sodium chloride. ? ?Meds ordered this encounter  ?Medications  ? DISCONTD: Accu-Chek Softclix Lancets lancets  ?  Sig: Use as instructed  ?  Dispense:  100 each  ?  Refill:  12  ? DISCONTD: Glucose Blood (BLOOD GLUCOSE TEST STRIPS 333) STRP  ?  Sig: 1 each by In Vitro route 2 (two) times daily. Test strips for Accucheck guide glucometer  ?  Dispense:  100 strip  ?  Refill:  12  ? Accu-Chek Softclix Lancets lancets  ?  Sig: Use as instructed  ?  Dispense:  100 each  ?  Refill:  12  ?  Order Specific Question:   Supervising Provider  ?  Answer:   Penni Homans A [[id number]]  ? Glucose Blood (BLOOD GLUCOSE TEST STRIPS 333) STRP  ?  Sig: 1 each by In Vitro route 2 (two) times daily. Test strips for Accucheck guide glucometer  ?  Dispense:  100 strip  ?  Refill:  12  ?  Order Specific Question:   Supervising Provider  ?  Answer:   Penni Homans A [[id number]]  ? amLODipine (NORVASC) 5 MG tablet  ?  Sig: TAKE 1 TABLET(5 MG) BY MOUTH DAILY  ?  Dispense:  90 tablet  ?  Refill:  1  ?  Order Specific Question:   Supervising Provider  ?  Answer:   Penni Homans A [[id number]]  ? atorvastatin (LIPITOR) 10 MG tablet  ?  Sig: Take 1 tablet (10 mg total) by mouth daily.  ?  Dispense:  90 tablet  ?  Refill:  1  ?  Order Specific Question:   Supervising Provider  ?  Answer:   Penni Homans A [[id number]]  ? alendronate (FOSAMAX) 70 MG tablet  ?  Sig: Take 1 tablet (70 mg total) by mouth every 7 (seven) days. Take with a full glass of water on an empty stomach.  ?  Dispense:  12 tablet  ?  Refill:  3  ?  Order Specific Question:   Supervising Provider  ?  Answer:   Penni Homans A [[id number]]  ? ?

## 2021-12-09 NOTE — Assessment & Plan Note (Signed)
Clinically stable. Not checking sugars, not currently taking metformin. Will see how her A1C looks.  ?

## 2021-12-09 NOTE — Patient Instructions (Addendum)
Please complete lab work prior to leaving.  ?Please get a bivalent covid booster at your pharmacy.  ?

## 2021-12-09 NOTE — Assessment & Plan Note (Signed)
Continues fosamax. Will repeat bone density.  ?

## 2021-12-11 ENCOUNTER — Encounter: Payer: Self-pay | Admitting: Family

## 2021-12-14 ENCOUNTER — Telehealth: Payer: Self-pay | Admitting: Family

## 2021-12-14 ENCOUNTER — Other Ambulatory Visit: Payer: Self-pay

## 2021-12-14 DIAGNOSIS — E119 Type 2 diabetes mellitus without complications: Secondary | ICD-10-CM

## 2021-12-14 MED ORDER — BLOOD GLUCOSE TEST STRIPS 333 VI STRP: 1.0000 | ORAL_STRIP | Freq: Two times a day (BID) | 12 refills | Status: DC

## 2021-12-14 NOTE — Telephone Encounter (Signed)
Caller: Tram To   Pt's daughter states pharmacy informed her they did not receive pt's rx request for test strips

## 2021-12-14 NOTE — Telephone Encounter (Signed)
Per pharmacy the rx was received but it was missing the diagnosis code. Rx sent again with information requested

## 2021-12-14 NOTE — Progress Notes (Signed)
Mailed out to pt 

## 2021-12-15 ENCOUNTER — Other Ambulatory Visit: Payer: Self-pay

## 2021-12-15 DIAGNOSIS — E119 Type 2 diabetes mellitus without complications: Secondary | ICD-10-CM

## 2021-12-15 MED ORDER — BLOOD GLUCOSE MONITOR KIT: PACK | 0 refills | Status: DC

## 2021-12-17 ENCOUNTER — Ambulatory Visit (HOSPITAL_BASED_OUTPATIENT_CLINIC_OR_DEPARTMENT_OTHER)
Admission: RE | Admit: 2021-12-17 | Discharge: 2021-12-17 | Disposition: A | Discharge status: Home / Self Care | Payer: Medicare HMO | Source: Ambulatory Visit | Attending: Family | Admitting: Family

## 2021-12-17 DIAGNOSIS — M810 Age-related osteoporosis without current pathological fracture: Secondary | ICD-10-CM

## 2021-12-24 ENCOUNTER — Ambulatory Visit (HOSPITAL_BASED_OUTPATIENT_CLINIC_OR_DEPARTMENT_OTHER)
Admission: RE | Admit: 2021-12-24 | Discharge: 2021-12-24 | Disposition: A | Discharge status: Home / Self Care | Payer: Medicare HMO | Source: Ambulatory Visit | Attending: Family | Admitting: Family

## 2021-12-24 DIAGNOSIS — M8589 Other specified disorders of bone density and structure, multiple sites: Secondary | ICD-10-CM | POA: Diagnosis not present

## 2021-12-24 DIAGNOSIS — M810 Age-related osteoporosis without current pathological fracture: Secondary | ICD-10-CM | POA: Insufficient documentation

## 2021-12-24 DIAGNOSIS — Z780 Asymptomatic menopausal state: Secondary | ICD-10-CM | POA: Diagnosis not present

## 2021-12-25 ENCOUNTER — Encounter: Payer: Self-pay | Admitting: Family

## 2021-12-28 NOTE — Progress Notes (Signed)
Mailed out to patient 

## 2022-01-12 DIAGNOSIS — Z7983 Long term (current) use of bisphosphonates: Secondary | ICD-10-CM | POA: Diagnosis not present

## 2022-01-12 DIAGNOSIS — M8580 Other specified disorders of bone density and structure, unspecified site: Secondary | ICD-10-CM | POA: Diagnosis not present

## 2022-01-12 DIAGNOSIS — K219 Gastro-esophageal reflux disease without esophagitis: Secondary | ICD-10-CM | POA: Diagnosis not present

## 2022-01-12 DIAGNOSIS — E119 Type 2 diabetes mellitus without complications: Secondary | ICD-10-CM | POA: Diagnosis not present

## 2022-01-12 DIAGNOSIS — E785 Hyperlipidemia, unspecified: Secondary | ICD-10-CM | POA: Diagnosis not present

## 2022-01-12 DIAGNOSIS — M810 Age-related osteoporosis without current pathological fracture: Secondary | ICD-10-CM | POA: Diagnosis not present

## 2022-01-12 DIAGNOSIS — I10 Essential (primary) hypertension: Secondary | ICD-10-CM | POA: Diagnosis not present

## 2022-01-12 DIAGNOSIS — R32 Unspecified urinary incontinence: Secondary | ICD-10-CM | POA: Diagnosis not present

## 2022-03-11 ENCOUNTER — Telehealth: Payer: Self-pay | Admitting: Family

## 2022-03-11 DIAGNOSIS — E119 Type 2 diabetes mellitus without complications: Secondary | ICD-10-CM

## 2022-03-11 MED ORDER — BLOOD GLUCOSE TEST STRIPS 333 VI STRP: ORAL_STRIP | 12 refills | Status: DC

## 2022-03-11 NOTE — Telephone Encounter (Signed)
Rx sent 

## 2022-03-11 NOTE — Telephone Encounter (Signed)
Pt is needing the accu check test strips.   [place], [place].   [address] [place] [address]  Phone:  [phone]  Fax:  [phone]  DEA #:

## 2022-04-14 ENCOUNTER — Other Ambulatory Visit: Payer: Self-pay | Admitting: Family

## 2022-04-29 ENCOUNTER — Encounter: Payer: Self-pay | Admitting: Family

## 2022-04-29 DIAGNOSIS — H35372 Puckering of macula, left eye: Secondary | ICD-10-CM | POA: Diagnosis not present

## 2022-04-29 DIAGNOSIS — H25813 Combined forms of age-related cataract, bilateral: Secondary | ICD-10-CM | POA: Diagnosis not present

## 2022-04-29 DIAGNOSIS — E119 Type 2 diabetes mellitus without complications: Secondary | ICD-10-CM | POA: Diagnosis not present

## 2022-04-29 DIAGNOSIS — H04213 Epiphora due to excess lacrimation, bilateral lacrimal glands: Secondary | ICD-10-CM | POA: Diagnosis not present

## 2022-04-29 LAB — HM DIABETES EYE EXAM

## 2022-07-22 ENCOUNTER — Other Ambulatory Visit: Payer: Self-pay | Admitting: Family

## 2022-08-03 ENCOUNTER — Telehealth: Payer: Self-pay | Admitting: Family

## 2022-08-03 ENCOUNTER — Ambulatory Visit (INDEPENDENT_AMBULATORY_CARE_PROVIDER_SITE_OTHER): Payer: Medicare HMO | Admitting: Family

## 2022-08-03 ENCOUNTER — Encounter: Payer: Self-pay | Admitting: Family

## 2022-08-03 VITALS — BP 134/59 | HR 72 | Temp 98.4°F | Resp 16 | Wt 124.0 lb

## 2022-08-03 DIAGNOSIS — Z23 Encounter for immunization: Secondary | ICD-10-CM

## 2022-08-03 DIAGNOSIS — Z7185 Encounter for immunization safety counseling: Secondary | ICD-10-CM

## 2022-08-03 DIAGNOSIS — E785 Hyperlipidemia, unspecified: Secondary | ICD-10-CM

## 2022-08-03 DIAGNOSIS — Z0000 Encounter for general adult medical examination without abnormal findings: Secondary | ICD-10-CM | POA: Diagnosis not present

## 2022-08-03 DIAGNOSIS — E1165 Type 2 diabetes mellitus with hyperglycemia: Secondary | ICD-10-CM

## 2022-08-03 DIAGNOSIS — I10 Essential (primary) hypertension: Secondary | ICD-10-CM

## 2022-08-03 DIAGNOSIS — E119 Type 2 diabetes mellitus without complications: Secondary | ICD-10-CM

## 2022-08-03 DIAGNOSIS — M810 Age-related osteoporosis without current pathological fracture: Secondary | ICD-10-CM

## 2022-08-03 MED ORDER — ATOVAQUONE-PROGUANIL HCL 250-100 MG PO TABS: ORAL_TABLET | ORAL | 0 refills | Status: DC

## 2022-08-03 MED ORDER — ATORVASTATIN CALCIUM 10 MG PO TABS
10.0000 mg | ORAL_TABLET | Freq: Every day | ORAL | 1 refills | Status: DC
Start: 2022-08-03 — End: 2023-05-11

## 2022-08-03 MED ORDER — ALENDRONATE SODIUM 70 MG PO TABS: 70.0000 mg | ORAL_TABLET | ORAL | 1 refills | Status: DC

## 2022-08-03 MED ORDER — BLOOD GLUCOSE MONITOR KIT: PACK | 0 refills | Status: AC

## 2022-08-03 MED ORDER — AMLODIPINE BESYLATE 5 MG PO TABS: 5.0000 mg | ORAL_TABLET | Freq: Every day | ORAL | 1 refills | Status: DC

## 2022-08-03 MED ORDER — VIVOTIF PO CPDR: DELAYED_RELEASE_CAPSULE | ORAL | 0 refills | Status: DC

## 2022-08-03 NOTE — Patient Instructions (Signed)
Please begin Vivotif dosing (typhoid vaccine 1 week before you leave) Start Malarone (malaria vaccine) just before you leave for you trip.  We gave you Twinrix vaccine (Hepatitis A/B) and flu shot today. Please get the covid booster at your pharmacy. Complete lab work prior to leaving.

## 2022-08-03 NOTE — Telephone Encounter (Signed)
Please call [facility] and request dates of shingrix?

## 2022-08-03 NOTE — Progress Notes (Signed)
Subjective:   By signing my name below, I, Hailey Rasmussen, attest that this documentation has been prepared under the direction and in the presence of Hailey Alar, NP. 08/03/2022   Patient ID: Hailey Rasmussen, female    DOB: June 27, 1951, 72 y.o.   MRN: [id number]  Chief Complaint  Patient presents with   Diabetes    Here for follow up   Hyperlipidemia    Here for follow up, needs refills   Hypertension    Here for follow up, need refills   Osteoporosis    Follow up, needs refills    Diabetes  Hyperlipidemia  Hypertension   Patient is in today for a follow up visit. Her husband is present with her during this visit. A virtual translation is assisting with this visit. Translator ID number is [id number].   Refills: She is requesting a refill on 5 mg amlodipine, 70 mg fosamax, 10 mg atorvastatin. She is going on vacation to [place] for 3 months and is requesting a 90 day supply for refills.   Blood pressure: her blood pressure is doing well while taking 5 mg amlodipine daily PO.  BP Readings from Last 3 Encounters:  08/03/22 (!) 134/59  12/09/21 (!) 125/57  08/12/21 (!) 149/69   Pulse Readings from Last 3 Encounters:  08/03/22 72  12/09/21 69  08/12/21 76   Blood sugar: She cannot check her blood sugar at home due to not have the machine to check it.  Lab Results  Component Value Date   HGBA1C 6.9 (H) 12/09/2021   Mammogram: She is due for mammogram appointment.   Immunizations: She received the shingles vaccine last year at her pharmacy. She does not have the latest Covid-19 booster vaccine. She does not have the hepatitis A or B vaccines and is interested in receiving it. She does not have the flu vaccine this year. She does not have the typhoid vaccine.    Past Medical History:  Diagnosis Date   Diabetes mellitus without complication ([facility])    Fatty liver    Hyperlipidemia    Hypertension    Osteoporosis 07/16/2016   Ventral hernia     Past Surgical History:   Procedure Laterality Date   COLONOSCOPY      Family History  Problem Relation Age of Onset   Heart disease Father    Colon cancer Neg Hx    Esophageal cancer Neg Hx    Rectal cancer Neg Hx    Stomach cancer Neg Hx     Social History   Socioeconomic History   Marital status: Married    Spouse name: Not on file   Number of children: Not on file   Years of education: Not on file   Highest education level: Not on file  Occupational History   Not on file  Tobacco Use   Smoking status: Never   Smokeless tobacco: Never  Vaping Use   Vaping Use: Never used  Substance and Sexual Activity   Alcohol use: No    Alcohol/week: 0.0 standard drinks of alcohol   Drug use: Never   Sexual activity: Not on file  Other Topics Concern   Not on file  Social History Narrative   Lives with her husband and son   She has 7 children (one daughter died at age 34) 35 children all live in [place]   Retired homemaker   She completed high school   Moved to [place] from [place] in [place]  Determinants of Health   Financial Resource Strain: Not on file  Food Insecurity: Not on file  Transportation Needs: Not on file  Physical Activity: Not on file  Stress: Not on file  Social Connections: Not on file  Intimate Partner Violence: Not on file    Outpatient Medications Prior to Visit  Medication Sig Dispense Refill   Accu-Chek Softclix Lancets lancets Use as instructed 100 each 12   alendronate (FOSAMAX) 70 MG tablet Take 1 tablet (70 mg total) by mouth every 7 (seven) days. Take with a full glass of water on an empty stomach. 12 tablet 3   amLODipine (NORVASC) 5 MG tablet Take 1 tablet (5 mg total) by mouth daily. 30 tablet 0   atorvastatin (LIPITOR) 10 MG tablet Take 1 tablet (10 mg total) by mouth daily. 30 tablet 0   blood glucose meter kit and supplies KIT Dispense based on patient and insurance preference. Use up to four times daily as directed. ONE TOUCH MONITOR 1 EACH, STRIPS 100 EACH,  AND LANCETS 100 EACH. Controlled type 2 diabetes mellitus without complication, without long-term current use of insulin [E11.9] 1 each 0   Calcium Carbonate-Vitamin D (CALTRATE 600+D) 600-400 MG-UNIT tablet Take 1 tablet by mouth 2 (two) times daily.     Glucose Blood (BLOOD GLUCOSE TEST STRIPS 333) STRP Test strips for Accucheck guide glucometer- check once daily 100 strip 12   Zoster Vaccine Adjuvanted [facility]) injection Inject 0.'5mg'$  IM now and repeat in 2-6 months. 0.5 mL 1   Facility-Administered Medications Prior to Visit  Medication Dose Route Frequency Provider Last Rate Last Admin   0.9 %  sodium chloride infusion  500 mL Intravenous Once Mansouraty, Telford Nab., MD        No Known Allergies  ROS     Objective:    Physical Exam Constitutional:      General: She is not in acute distress.    Appearance: Normal appearance. She is not ill-appearing.  HENT:     Head: Normocephalic and atraumatic.     Right Ear: External ear normal.     Left Ear: External ear normal.  Eyes:     Extraocular Movements: Extraocular movements intact.     Pupils: Pupils are equal, round, and reactive to light.  Cardiovascular:     Rate and Rhythm: Normal rate and regular rhythm.     Heart sounds: Normal heart sounds. No murmur heard.    No gallop.  Pulmonary:     Effort: Pulmonary effort is normal. No respiratory distress.     Breath sounds: Normal breath sounds. No wheezing or rales.  Skin:    General: Skin is warm and dry.  Neurological:     Mental Status: She is alert and oriented to person, place, and time.  Psychiatric:        Judgment: Judgment normal.     BP (!) 134/59 (BP Location: Right Arm, Patient Position: Sitting, Cuff Size: Small)   Pulse 72   Temp 98.4 F (36.9 C) (Oral)   Resp 16   Wt 124 lb (56.2 kg)   LMP 07/27/1995   SpO2 100%   BMI 25.92 kg/m  Wt Readings from Last 3 Encounters:  08/03/22 124 lb (56.2 kg)  12/09/21 123 lb 3.2 oz (55.9 kg)  08/12/21 119 lb  (54 kg)       Assessment & Plan:  There are no diagnoses linked to this encounter.  I, Hailey Rasmussen, personally preformed the services described in this documentation.  All medical record entries made by the scribe were at my direction and in my presence.  I have reviewed the chart and discharge instructions (if applicable) and agree that the record reflects my personal performance and is accurate and complete. 08/03/2022   I,Hailey Rasmussen,acting as a Education administrator for Nance Pear, NP.,have documented all relevant documentation on the behalf of Nance Pear, NP,as directed by  Nance Pear, NP while in the presence of Nance Pear, NP.   Hailey [employer]

## 2022-08-04 ENCOUNTER — Ambulatory Visit (HOSPITAL_BASED_OUTPATIENT_CLINIC_OR_DEPARTMENT_OTHER)
Admission: RE | Admit: 2022-08-04 | Discharge: 2022-08-04 | Disposition: A | Discharge status: Home / Self Care | Payer: Medicare HMO | Source: Ambulatory Visit | Attending: Family | Admitting: Family

## 2022-08-04 ENCOUNTER — Encounter (HOSPITAL_BASED_OUTPATIENT_CLINIC_OR_DEPARTMENT_OTHER): Payer: Self-pay

## 2022-08-04 DIAGNOSIS — Z7185 Encounter for immunization safety counseling: Secondary | ICD-10-CM | POA: Insufficient documentation

## 2022-08-04 DIAGNOSIS — Z0000 Encounter for general adult medical examination without abnormal findings: Secondary | ICD-10-CM | POA: Diagnosis present

## 2022-08-04 DIAGNOSIS — Z1231 Encounter for screening mammogram for malignant neoplasm of breast: Secondary | ICD-10-CM | POA: Diagnosis not present

## 2022-08-04 LAB — COMPREHENSIVE METABOLIC PANEL
ALT: 27 U/L (ref 0–35)
AST: 35 U/L (ref 0–37)
Albumin: 4.6 g/dL (ref 3.5–5.2)
Alkaline Phosphatase: 64 U/L (ref 39–117)
BUN: 15 mg/dL (ref 6–23)
CO2: 27 mEq/L (ref 19–32)
Calcium: 9.8 mg/dL (ref 8.4–10.5)
Chloride: 104 mEq/L (ref 96–112)
Creatinine, Ser: 0.77 mg/dL (ref 0.40–1.20)
GFR: 76.45 mL/min (ref >60.00)
Glucose, Bld: 102 mg/dL — ABNORMAL HIGH (ref 70–99)
Potassium: 4.4 mEq/L (ref 3.5–5.1)
Sodium: 141 mEq/L (ref 135–145)
Total Bilirubin: 0.5 mg/dL (ref 0.2–1.2)
Total Protein: 7.9 g/dL (ref 6.0–8.3)

## 2022-08-04 LAB — LIPID PANEL
Cholesterol: 141 mg/dL (ref 0–200)
HDL: 49.2 mg/dL (ref >39.00)
NonHDL: 91.94
Total CHOL/HDL Ratio: 3
Triglycerides: 329 mg/dL — ABNORMAL HIGH (ref 0.0–149.0)
VLDL: 65.8 mg/dL — ABNORMAL HIGH (ref 0.0–40.0)

## 2022-08-04 LAB — LDL CHOLESTEROL, DIRECT: Direct LDL: 57 mg/dL

## 2022-08-04 LAB — HEMOGLOBIN A1C: Hgb A1c MFr Bld: 7.6 % — ABNORMAL HIGH (ref 4.6–6.5)

## 2022-08-04 NOTE — Assessment & Plan Note (Signed)
BP Readings from Last 3 Encounters:  08/03/22 (!) 134/59  12/09/21 (!) 125/57  08/12/21 (!) 149/69   BP is at goal on amlodipine.

## 2022-08-04 NOTE — Assessment & Plan Note (Signed)
Lab Results  Component Value Date   HGBA1C 6.9 (H) 12/09/2021   HGBA1C 6.2 03/04/2021   HGBA1C 6.3 10/01/2020   Lab Results  Component Value Date   MICROALBUR <0.7 03/04/2021   LDLCALC 82 10/01/2020   CREATININE 0.73 12/09/2021   Last A1C was at goal. Continue diabetic diet efforts.

## 2022-08-04 NOTE — Assessment & Plan Note (Signed)
Lab Results  Component Value Date   CHOL 169 10/01/2020   HDL 49.50 10/01/2020   LDLCALC 82 10/01/2020   LDLDIRECT 98.0 12/19/2019   TRIG 184.0 (H) 10/01/2020   CHOLHDL 3 10/01/2020   Due for follow up lipid panel. Continue atorvastatin.  Last LDL was at goal.

## 2022-08-04 NOTE — Assessment & Plan Note (Signed)
Patient travelling to [place] for 3 months and leaves in 1 week.  Recommended twinrix, flu shot here today. She plan to get the covid vaccine at the pharmacy.  Also provided rx for Malarone and vivotif.

## 2022-08-04 NOTE — Assessment & Plan Note (Signed)
Continues fosamax.  Bone density up to date.

## 2022-08-04 NOTE — Telephone Encounter (Signed)
Per pharmacist patient had shingle on 03/12/21 and 08/20/21 at [facility]. Information updated.

## 2022-08-05 ENCOUNTER — Telehealth: Payer: Self-pay | Admitting: Family

## 2022-08-05 MED ORDER — METFORMIN HCL 500 MG PO TABS: 500.0000 mg | ORAL_TABLET | Freq: Two times a day (BID) | ORAL | 1 refills | Status: DC

## 2022-08-05 NOTE — Telephone Encounter (Signed)
Called but no answer, lvm for son or daughter to call back

## 2022-08-05 NOTE — Telephone Encounter (Signed)
Sugar control has worsened.  I would like her to restart metformin '500mg'$  bid and work on diabetic diet.   FYI- Son speaks good english.

## 2022-08-09 ENCOUNTER — Other Ambulatory Visit: Payer: Self-pay | Admitting: *Deleted

## 2022-08-09 DIAGNOSIS — E119 Type 2 diabetes mellitus without complications: Secondary | ICD-10-CM

## 2022-08-09 MED ORDER — ONETOUCH ULTRA VI STRP: ORAL_STRIP | 1 refills | Status: DC

## 2022-08-09 NOTE — Telephone Encounter (Signed)
Patient daughter Dulce Sellar notified and she will go get medication.

## 2022-11-01 NOTE — Progress Notes (Signed)
Subjective:   By signing my name below, I, [name], attest that this documentation has been prepared under the direction and in the presence of Lemont Fillers, NP 11/02/22   Patient ID: Hailey Rasmussen, female    DOB: 17-Apr-1951, 72 y.o.   MRN: [id number]  Chief Complaint  Patient presents with   Diabetes    Here for follow up   Hypertension    Here for follow up    HPI Patient is in today for a follow up. A virtual interpretor was present during this visit. She is also accompanied by family member.   Hypertension: She has been managing her blood pressure well. Her blood pressure is controlled at visit.  BP Readings from Last 3 Encounters:  11/02/22 119/63  08/03/22 (!) 134/59  12/09/21 (!) 125/57   Diabetes: She is compliant with Metformin. She has been eating well overall.  Lab Results  Component Value Date   HGBA1C 7.6 (H) 08/03/2022   Allergies: She states her allergies have been causing her to cough since getting back from a trip to [place]. She reports it has been improving.   Past Medical History:  Diagnosis Date   Diabetes mellitus without complication ([facility])    Fatty liver    Hyperlipidemia    Hypertension    Osteoporosis 07/16/2016   Ventral hernia     Past Surgical History:  Procedure Laterality Date   COLONOSCOPY      Family History  Problem Relation Age of Onset   Heart disease Father    Colon cancer Neg Hx    Esophageal cancer Neg Hx    Rectal cancer Neg Hx    Stomach cancer Neg Hx     Social History   Socioeconomic History   Marital status: Married    Spouse name: Not on file   Number of children: Not on file   Years of education: Not on file   Highest education level: Not on file  Occupational History   Not on file  Tobacco Use   Smoking status: Never   Smokeless tobacco: Never  Vaping Use   Vaping Use: Never used  Substance and Sexual Activity   Alcohol use: No    Alcohol/week: 0.0 standard drinks of alcohol    Drug use: Never   Sexual activity: Not on file  Other Topics Concern   Not on file  Social History Narrative   Lives with her husband and son   She has 7 children (one daughter died at age 70) 6 children all live in [place]   Retired homemaker   She completed high school   Moved to [place] from [place] in 1997      Social Determinants of Health   Financial Resource Strain: Not on file  Food Insecurity: Not on file  Transportation Needs: Not on file  Physical Activity: Not on file  Stress: Not on file  Social Connections: Not on file  Intimate Partner Violence: Not on file    Outpatient Medications Prior to Visit  Medication Sig Dispense Refill   Accu-Chek Softclix Lancets lancets Use as instructed 100 each 12   alendronate (FOSAMAX) 70 MG tablet Take 1 tablet (70 mg total) by mouth every 7 (seven) days. Take with a full glass of water on an empty stomach. 12 tablet 1   amLODipine (NORVASC) 5 MG tablet Take 1 tablet (5 mg total) by mouth daily. 90 tablet 1   atorvastatin (LIPITOR) 10 MG tablet Take 1 tablet (10  mg total) by mouth daily. 90 tablet 1   atovaquone-proguanil (MALARONE) 250-100 MG TABS tablet One tablet by mouth once daily; start 1 to 2 days prior to travel to [place], continue throughout the stay and for 7 days after returning 100 tablet 0   blood glucose meter kit and supplies KIT Dispense based on patient and insurance preference. Use up to four times daily as directed. ONE TOUCH MONITOR 1 EACH, STRIPS 100 EACH, AND LANCETS 100 EACH. Controlled type 2 diabetes mellitus without complication, without long-term current use of insulin [E11.9] 1 each 0   Calcium Carbonate-Vitamin D (CALTRATE 600+D) 600-400 MG-UNIT tablet Take 1 tablet by mouth 2 (two) times daily.     metFORMIN (GLUCOPHAGE) 500 MG tablet Take 1 tablet (500 mg total) by mouth 2 (two) times daily with a meal. 180 tablet 1   typhoid (VIVOTIF) DR capsule One capsule on alternate days (day 1, 3, 5, and 7) for a total of 4  doses; all doses should be complete at least 1 week prior to potential exposure 4 capsule 0   Zoster Vaccine Adjuvanted [facility]) injection Inject 0.5mg  IM now and repeat in 2-6 months. 0.5 mL 1   glucose blood (ONETOUCH ULTRA) test strip Check blood sugar 3 times a day.  Dx code: E11.65 100 each 1   Facility-Administered Medications Prior to Visit  Medication Dose Route Frequency Provider Last Rate Last Admin   0.9 %  sodium chloride infusion  500 mL Intravenous Once Mansouraty, Netty Starring., MD        No Known Allergies  Review of Systems  Respiratory:  Positive for cough (allergies).   Endo/Heme/Allergies:  Positive for environmental allergies.       Objective:    Physical Exam Constitutional:      General: She is not in acute distress.    Appearance: Normal appearance. She is well-developed.  HENT:     Head: Normocephalic and atraumatic.     Right Ear: External ear normal.     Left Ear: External ear normal.  Eyes:     General: No scleral icterus. Neck:     Thyroid: No thyromegaly.  Cardiovascular:     Rate and Rhythm: Normal rate and regular rhythm.     Heart sounds: Normal heart sounds. No murmur heard. Pulmonary:     Effort: Pulmonary effort is normal. No respiratory distress.     Breath sounds: Normal breath sounds. No wheezing.  Musculoskeletal:     Cervical back: Neck supple.  Skin:    General: Skin is warm and dry.  Neurological:     Mental Status: She is alert and oriented to person, place, and time.  Psychiatric:        Mood and Affect: Mood normal.        Behavior: Behavior normal.        Thought Content: Thought content normal.        Judgment: Judgment normal.     BP 119/63 (BP Location: Right Arm, Patient Position: Sitting, Cuff Size: Small)   Pulse 65   Temp 98.7 F (37.1 C) (Oral)   Resp 16   Wt 121 lb (54.9 kg)   LMP 07/27/1995   SpO2 (!) 66%   BMI 25.29 kg/m  Wt Readings from Last 3 Encounters:  11/02/22 121 lb (54.9 kg)  08/03/22 124  lb (56.2 kg)  12/09/21 123 lb 3.2 oz (55.9 kg)       Assessment & Plan:  Controlled type 2 diabetes mellitus without  complication, without long-term current use of insulin Assessment & Plan: Repeat A1C today. She is now back on metformin.   Orders: -     OneTouch Ultra; Check blood sugar 3 times a day.  Dx code: E11.65  Dispense: 100 each; Refill: 1 -     Hemoglobin A1c -     Comprehensive metabolic panel -     Microalbumin / creatinine urine ratio  Weight loss Assessment & Plan: Wt Readings from Last 3 Encounters:  11/02/22 121 lb (54.9 kg)  08/03/22 124 lb (56.2 kg)  12/09/21 123 lb 3.2 oz (55.9 kg)   Overall stable.    Hypertension, unspecified type Assessment & Plan: BP Readings from Last 3 Encounters:  11/02/22 119/63  08/03/22 (!) 134/59  12/09/21 (!) 125/57   BP stable.  Continue amlodipine 5mg .     Hyperlipidemia, unspecified hyperlipidemia type Assessment & Plan: Lab Results  Component Value Date   CHOL 141 08/03/2022   HDL 49.20 08/03/2022   LDLCALC 82 10/01/2020   LDLDIRECT 57.0 08/03/2022   TRIG 329.0 (H) 08/03/2022   CHOLHDL 3 08/03/2022   LDL at goal on atorvastatin.  Continue same.       I,Rachel Rivera,acting as a Neurosurgeonscribe for Lemont FillersMelissa S O'Sullivan, NP.,have documented all relevant documentation on the behalf of Lemont FillersMelissa S O'Sullivan, NP,as directed by  Lemont FillersMelissa S O'Sullivan, NP while in the presence of Lemont FillersMelissa S O'Sullivan, NP.   I, Lemont FillersMelissa S O'Sullivan, NP, personally preformed the services described in this documentation.  All medical record entries made by the scribe were at my direction and in my presence.  I have reviewed the chart and discharge instructions (if applicable) and agree that the record reflects my personal performance and is accurate and complete. 11/02/22   Lemont FillersMelissa S O'Sullivan, NP

## 2022-11-02 ENCOUNTER — Ambulatory Visit (INDEPENDENT_AMBULATORY_CARE_PROVIDER_SITE_OTHER): Payer: Medicare HMO | Admitting: Family

## 2022-11-02 VITALS — BP 119/63 | HR 65 | Temp 98.7°F | Resp 16 | Wt 121.0 lb

## 2022-11-02 DIAGNOSIS — E119 Type 2 diabetes mellitus without complications: Secondary | ICD-10-CM | POA: Diagnosis not present

## 2022-11-02 DIAGNOSIS — I10 Essential (primary) hypertension: Secondary | ICD-10-CM

## 2022-11-02 DIAGNOSIS — E785 Hyperlipidemia, unspecified: Secondary | ICD-10-CM | POA: Diagnosis not present

## 2022-11-02 DIAGNOSIS — R634 Abnormal weight loss: Secondary | ICD-10-CM

## 2022-11-02 MED ORDER — ONETOUCH ULTRA VI STRP: ORAL_STRIP | 1 refills | Status: DC

## 2022-11-02 NOTE — Assessment & Plan Note (Signed)
Lab Results  Component Value Date   CHOL 141 08/03/2022   HDL 49.20 08/03/2022   LDLCALC 82 10/01/2020   LDLDIRECT 57.0 08/03/2022   TRIG 329.0 (H) 08/03/2022   CHOLHDL 3 08/03/2022   LDL at goal on atorvastatin.  Continue same.

## 2022-11-02 NOTE — Assessment & Plan Note (Signed)
BP Readings from Last 3 Encounters:  11/02/22 119/63  08/03/22 (!) 134/59  12/09/21 (!) 125/57   BP stable.  Continue amlodipine 5mg .

## 2022-11-02 NOTE — Assessment & Plan Note (Signed)
Wt Readings from Last 3 Encounters:  11/02/22 121 lb (54.9 kg)  08/03/22 124 lb (56.2 kg)  12/09/21 123 lb 3.2 oz (55.9 kg)   Overall stable.

## 2022-11-02 NOTE — Assessment & Plan Note (Signed)
Repeat A1C today. She is now back on metformin.

## 2022-11-03 LAB — COMPREHENSIVE METABOLIC PANEL
ALT: 27 U/L (ref 0–35)
AST: 28 U/L (ref 0–37)
Albumin: 4.1 g/dL (ref 3.5–5.2)
Alkaline Phosphatase: 64 U/L (ref 39–117)
BUN: 13 mg/dL (ref 6–23)
CO2: 27 mEq/L (ref 19–32)
Calcium: 9.3 mg/dL (ref 8.4–10.5)
Chloride: 102 mEq/L (ref 96–112)
Creatinine, Ser: 0.67 mg/dL (ref 0.40–1.20)
GFR: 86.47 mL/min (ref >60.00)
Glucose, Bld: 162 mg/dL — ABNORMAL HIGH (ref 70–99)
Potassium: 3.9 mEq/L (ref 3.5–5.1)
Sodium: 138 mEq/L (ref 135–145)
Total Bilirubin: 0.4 mg/dL (ref 0.2–1.2)
Total Protein: 7 g/dL (ref 6.0–8.3)

## 2022-11-03 LAB — HEMOGLOBIN A1C: Hgb A1c MFr Bld: 7.3 % — ABNORMAL HIGH (ref 4.6–6.5)

## 2022-11-03 LAB — MICROALBUMIN / CREATININE URINE RATIO
Creatinine,U: 58.5 mg/dL
Microalb Creat Ratio: 1.2 mg/g (ref 0.0–30.0)
Microalb, Ur: <0.7 mg/dL (ref 0.0–1.9)

## 2022-11-04 ENCOUNTER — Telehealth: Payer: Self-pay | Admitting: Family

## 2022-11-04 MED ORDER — METFORMIN HCL 1000 MG PO TABS: 1000.0000 mg | ORAL_TABLET | Freq: Two times a day (BID) | ORAL | 1 refills | Status: DC

## 2022-11-04 NOTE — Telephone Encounter (Signed)
Sugar above goal. Please increase metformin form 500mg  bid to 1000mg  bid.

## 2022-11-04 NOTE — Telephone Encounter (Signed)
Called patient but no answer, left voice mail for patient to call back.   

## 2022-11-08 NOTE — Telephone Encounter (Signed)
Results and information given to patient's daughter. She will pick up medication

## 2023-01-08 ENCOUNTER — Other Ambulatory Visit: Payer: Self-pay | Admitting: Family

## 2023-01-08 MED ORDER — METFORMIN HCL 1000 MG PO TABS: 1000.0000 mg | ORAL_TABLET | Freq: Two times a day (BID) | ORAL | 1 refills | Status: DC

## 2023-01-11 ENCOUNTER — Other Ambulatory Visit: Payer: Self-pay | Admitting: Family

## 2023-01-13 ENCOUNTER — Other Ambulatory Visit: Payer: Self-pay | Admitting: Family

## 2023-05-11 ENCOUNTER — Other Ambulatory Visit: Payer: Self-pay | Admitting: Family

## 2023-06-07 ENCOUNTER — Ambulatory Visit: Payer: Medicare HMO | Admitting: Family

## 2023-06-07 VITALS — BP 111/56 | HR 68 | Temp 97.0°F | Resp 16 | Ht <= 58 in | Wt 115.0 lb

## 2023-06-07 DIAGNOSIS — H35379 Puckering of macula, unspecified eye: Secondary | ICD-10-CM

## 2023-06-07 DIAGNOSIS — R634 Abnormal weight loss: Secondary | ICD-10-CM

## 2023-06-07 DIAGNOSIS — E119 Type 2 diabetes mellitus without complications: Secondary | ICD-10-CM

## 2023-06-07 DIAGNOSIS — I10 Essential (primary) hypertension: Secondary | ICD-10-CM | POA: Diagnosis not present

## 2023-06-07 DIAGNOSIS — E785 Hyperlipidemia, unspecified: Secondary | ICD-10-CM | POA: Diagnosis not present

## 2023-06-07 DIAGNOSIS — Z7984 Long term (current) use of oral hypoglycemic drugs: Secondary | ICD-10-CM

## 2023-06-07 MED ORDER — GLUCOSE BLOOD VI STRP: ORAL_STRIP | 12 refills | Status: DC

## 2023-06-07 MED ORDER — ALENDRONATE SODIUM 70 MG PO TABS: 70.0000 mg | ORAL_TABLET | ORAL | 1 refills | Status: AC

## 2023-06-07 MED ORDER — ATORVASTATIN CALCIUM 10 MG PO TABS: 10.0000 mg | ORAL_TABLET | Freq: Every day | ORAL | 1 refills | Status: DC

## 2023-06-07 MED ORDER — METFORMIN HCL 500 MG PO TABS: 500.0000 mg | ORAL_TABLET | Freq: Two times a day (BID) | ORAL | 1 refills | Status: DC

## 2023-06-07 MED ORDER — AMLODIPINE BESYLATE 5 MG PO TABS: 5.0000 mg | ORAL_TABLET | Freq: Every day | ORAL | 1 refills | Status: DC

## 2023-06-07 NOTE — Progress Notes (Signed)
Subjective:     Patient ID: Hailey Rasmussen, female    DOB: [birth year], 72 y.o.   MRN: [id number]  Chief Complaint  Patient presents with   Diabetes    Here for follow up before traveling to [place] on 11/25    Diabetes    Discussed the use of AI scribe software for clinical note transcription with the patient, who gave verbal consent to proceed.  History of Present Illness   The patient, with a history of diabetes, presents for a medication refill in preparation for a three-month trip to [place]. She reports a few pounds of weight loss, which she attributes to increased physical activity and a normal appetite. She also expresses concerns about her eye health, specifically about a procedure that was recommended to her. She is unsure about the details of the procedure and expresses fear about it. She also reports difficulty using her blood sugar meter at home and requests instructions on how to use it.     Lab Results  Component Value Date   HGBA1C 7.3 (H) 11/02/2022   HGBA1C 7.6 (H) 08/03/2022   HGBA1C 6.9 (H) 12/09/2021   Lab Results  Component Value Date   MICROALBUR <0.7 11/02/2022   LDLCALC 82 10/01/2020   CREATININE 0.67 11/02/2022     Utilized video interpreter service [employer] # [phone]    Health Maintenance Due  Topic Date Due   Medicare Annual Wellness (AWV)  05/12/2016   COVID-19 Vaccine (5 - 2023-24 season) 03/27/2023   OPHTHALMOLOGY EXAM  04/30/2023   HEMOGLOBIN A1C  05/04/2023    Past Medical History:  Diagnosis Date   Diabetes mellitus without complication (HCC)    Fatty liver    Hyperlipidemia    Hypertension    Osteoporosis 07/16/2016   Ventral hernia     Past Surgical History:  Procedure Laterality Date   COLONOSCOPY      Family History  Problem Relation Age of Onset   Heart disease Father    Colon cancer Neg Hx    Esophageal cancer Neg Hx    Rectal cancer Neg Hx    Stomach cancer Neg Hx     Social History   Socioeconomic History    Marital status: Married    Spouse name: Not on file   Number of children: Not on file   Years of education: Not on file   Highest education level: Not on file  Occupational History   Not on file  Tobacco Use   Smoking status: Never   Smokeless tobacco: Never  Vaping Use   Vaping status: Never Used  Substance and Sexual Activity   Alcohol use: No    Alcohol/week: 0.0 standard drinks of alcohol   Drug use: Never   Sexual activity: Not on file  Other Topics Concern   Not on file  Social History Narrative   Lives with her husband and son   She has 7 children (one daughter died at age 74) 6 children all live in [place]   Retired homemaker   She completed high school   Moved to [place] from [place] in 1997      Social Determinants of Health   Financial Resource Strain: Not on file  Food Insecurity: Not on file  Transportation Needs: Not on file  Physical Activity: Not on file  Stress: Not on file  Social Connections: Not on file  Intimate Partner Violence: Not on file    Outpatient Medications Prior to Visit  Medication Sig Dispense  Refill   Accu-Chek Softclix Lancets lancets Use as instructed 100 each 12   blood glucose meter kit and supplies KIT Dispense based on patient and insurance preference. Use up to four times daily as directed. ONE TOUCH MONITOR 1 EACH, STRIPS 100 EACH, AND LANCETS 100 EACH. Controlled type 2 diabetes mellitus without complication, without long-term current use of insulin [E11.9] 1 each 0   Calcium Carbonate-Vitamin D (CALTRATE 600+D) 600-400 MG-UNIT tablet Take 1 tablet by mouth 2 (two) times daily.     Zoster Vaccine Adjuvanted [facility]) injection Inject 0.5mg  IM now and repeat in 2-6 months. 0.5 mL 1   alendronate (FOSAMAX) 70 MG tablet Take 1 tablet (70 mg total) by mouth once a week. Take with full glass of water on empty stomach 12 tablet 0   amLODipine (NORVASC) 5 MG tablet Take 1 tablet (5 mg total) by mouth daily. 90 tablet 0   atorvastatin  (LIPITOR) 10 MG tablet Take 1 tablet (10 mg total) by mouth daily. 90 tablet 0   glucose blood (ONETOUCH ULTRA) test strip Check blood sugar 3 times a day.  Dx code: E11.65 100 each 1   metFORMIN (GLUCOPHAGE) 500 MG tablet Take 1 tablet (500 mg total) by mouth 2 (two) times daily with a meal. 180 tablet 0   Facility-Administered Medications Prior to Visit  Medication Dose Route Frequency Provider Last Rate Last Admin   0.9 %  sodium chloride infusion  500 mL Intravenous Once Mansouraty, Netty Starring., MD        No Known Allergies  ROS    See HPI Objective:    Physical Exam Constitutional:      General: She is not in acute distress.    Appearance: Normal appearance. She is well-developed.  HENT:     Head: Normocephalic and atraumatic.     Right Ear: External ear normal.     Left Ear: External ear normal.  Eyes:     General: No scleral icterus. Neck:     Thyroid: No thyromegaly.  Cardiovascular:     Rate and Rhythm: Normal rate and regular rhythm.     Heart sounds: Normal heart sounds. No murmur heard. Pulmonary:     Effort: Pulmonary effort is normal. No respiratory distress.     Breath sounds: Normal breath sounds. No wheezing.  Musculoskeletal:     Cervical back: Neck supple.  Skin:    General: Skin is warm and dry.  Neurological:     Mental Status: She is alert and oriented to person, place, and time.  Psychiatric:        Mood and Affect: Mood normal.        Behavior: Behavior normal.        Thought Content: Thought content normal.        Judgment: Judgment normal.      BP (!) 111/56 (BP Location: Right Arm, Patient Position: Sitting, Cuff Size: Small)   Pulse 68   Temp (!) 97 F (36.1 C) (Oral)   Resp 16   Ht 4\' 10"  (1.473 m)   Wt 115 lb (52.2 kg)   LMP 07/27/1995   SpO2 97%   BMI 24.04 kg/m  Wt Readings from Last 3 Encounters:  06/07/23 115 lb (52.2 kg)  11/02/22 121 lb (54.9 kg)  08/03/22 124 lb (56.2 kg)       Assessment & Plan:   Problem List  Items Addressed This Visit       Unprioritized   Weight loss  Wt Readings from Last 3 Encounters:  06/07/23 115 lb (52.2 kg)  11/02/22 121 lb (54.9 kg)  08/03/22 124 lb (56.2 kg)   Continue regular exercise.       Relevant Orders   TSH   Hypertension - Primary    Wt Readings from Last 3 Encounters:  06/07/23 115 lb (52.2 kg)  11/02/22 121 lb (54.9 kg)  08/03/22 124 lb (56.2 kg)   BP at goal.  Continue amlodipine 5 mg.       Relevant Medications   atorvastatin (LIPITOR) 10 MG tablet   amLODipine (NORVASC) 5 MG tablet   Other Relevant Orders   Basic Metabolic Panel (BMET)   Hyperlipidemia    Lab Results  Component Value Date   CHOL 141 08/03/2022   HDL 49.20 08/03/2022   LDLCALC 82 10/01/2020   LDLDIRECT 57.0 08/03/2022   TRIG 329.0 (H) 08/03/2022   CHOLHDL 3 08/03/2022   LDL at goal.  Continue statin.       Relevant Medications   atorvastatin (LIPITOR) 10 MG tablet   amLODipine (NORVASC) 5 MG tablet   Epiretinal membrane    I advised pt to reach back out to her eye doctor to address any questions/concerns she may have about planned eye surgery.       Controlled type 2 diabetes mellitus without complication, without long-term current use of insulin (HCC)    Update A1C, CMA taught pt how to use her glucometer today. Continue metformin.       Relevant Medications   metFORMIN (GLUCOPHAGE) 500 MG tablet   atorvastatin (LIPITOR) 10 MG tablet   glucose blood test strip   Other Relevant Orders   HgB A1c    I have discontinued Marty T. Guitron's OneTouch Ultra. I am also having her start on glucose blood. Additionally, I am having her maintain her Calcium Carbonate-Vitamin D, Shingrix, Accu-Chek Softclix Lancets, blood glucose meter kit and supplies, metFORMIN, atorvastatin, amLODipine, and alendronate. We will continue to administer sodium chloride.  Meds ordered this encounter  Medications   metFORMIN (GLUCOPHAGE) 500 MG tablet    Sig: Take 1 tablet (500 mg  total) by mouth 2 (two) times daily with a meal.    Dispense:  180 tablet    Refill:  1    Order Specific Question:   Supervising Provider    Answer:   Danise Edge A [[id number]]   atorvastatin (LIPITOR) 10 MG tablet    Sig: Take 1 tablet (10 mg total) by mouth daily.    Dispense:  90 tablet    Refill:  1    Order Specific Question:   Supervising Provider    Answer:   Danise Edge A [[id number]]   amLODipine (NORVASC) 5 MG tablet    Sig: Take 1 tablet (5 mg total) by mouth daily.    Dispense:  90 tablet    Refill:  1    Order Specific Question:   Supervising Provider    Answer:   Danise Edge A [[id number]]   alendronate (FOSAMAX) 70 MG tablet    Sig: Take 1 tablet (70 mg total) by mouth once a week. Take with full glass of water on empty stomach    Dispense:  12 tablet    Refill:  1    Order Specific Question:   Supervising Provider    Answer:   Danise Edge A [[id number]]   glucose blood test strip    Sig: Use as instructed    Dispense:  100 each  Refill:  12    For accu-check guide me

## 2023-06-07 NOTE — Assessment & Plan Note (Addendum)
Wt Readings from Last 3 Encounters:  06/07/23 115 lb (52.2 kg)  11/02/22 121 lb (54.9 kg)  08/03/22 124 lb (56.2 kg)   BP at goal.  Continue amlodipine 5 mg.

## 2023-06-07 NOTE — Assessment & Plan Note (Signed)
Lab Results  Component Value Date   CHOL 141 08/03/2022   HDL 49.20 08/03/2022   LDLCALC 82 10/01/2020   LDLDIRECT 57.0 08/03/2022   TRIG 329.0 (H) 08/03/2022   CHOLHDL 3 08/03/2022   LDL at goal.  Continue statin.

## 2023-06-07 NOTE — Assessment & Plan Note (Signed)
I advised pt to reach back out to her eye doctor to address any questions/concerns she may have about planned eye surgery.

## 2023-06-07 NOTE — Assessment & Plan Note (Addendum)
Wt Readings from Last 3 Encounters:  06/07/23 115 lb (52.2 kg)  11/02/22 121 lb (54.9 kg)  08/03/22 124 lb (56.2 kg)   Continue regular exercise.

## 2023-06-07 NOTE — Assessment & Plan Note (Signed)
Update A1C, CMA taught pt how to use her glucometer today. Continue metformin.

## 2023-06-07 NOTE — Patient Instructions (Signed)
VISIT SUMMARY:  During today's visit, we discussed your upcoming trip to [place] and addressed your concerns about diabetes management, eye health, and immunizations. We also provided the necessary medication refills for your trip.  YOUR PLAN:  -MEDICATION REFILLS FOR TRAVEL: You are planning a 63-month trip to [place] and requested medication refills for the duration of your trip. We have provided a 90-day supply of your medications.  -DIABETES MANAGEMENT: You mentioned having difficulty using your blood sugar meter at home. We provided you with instructions on how to use it properly. Managing your blood sugar levels is crucial for your overall health, especially with your diabetes.  -EYE HEALTH: You have concerns about a planned eye procedure. We recommend scheduling a follow-up visit with your eye doctor to discuss and clarify the procedure.  -IMMUNIZATIONS: You have received the flu shot and the initial COVID-19 vaccine series but have not yet received the latest COVID-19 booster shot.  Please get your covid booster shot at the pharmacy.  INSTRUCTIONS:  Please schedule a follow-up appointment for 3-4 months after your return from [place] to review your health and any ongoing concerns.

## 2023-06-08 ENCOUNTER — Encounter: Payer: Self-pay | Admitting: Family

## 2023-06-08 ENCOUNTER — Telehealth: Payer: Self-pay | Admitting: Family

## 2023-06-08 LAB — BASIC METABOLIC PANEL
BUN: 20 mg/dL (ref 6–23)
CO2: 27 meq/L (ref 19–32)
Calcium: 9.6 mg/dL (ref 8.4–10.5)
Chloride: 103 meq/L (ref 96–112)
Creatinine, Ser: 0.74 mg/dL (ref 0.40–1.20)
GFR: 79.71 mL/min (ref >60.00)
Glucose, Bld: 120 mg/dL — ABNORMAL HIGH (ref 70–99)
Potassium: 4.6 meq/L (ref 3.5–5.1)
Sodium: 140 meq/L (ref 135–145)

## 2023-06-08 LAB — TSH: TSH: 2.99 u[IU]/mL (ref 0.35–5.50)

## 2023-06-08 LAB — HEMOGLOBIN A1C: Hgb A1c MFr Bld: 6.4 % (ref 4.6–6.5)

## 2023-06-08 NOTE — Telephone Encounter (Signed)
Called pharmacy with directions to use twice a day, per provider.

## 2023-06-08 NOTE — Telephone Encounter (Signed)
Hailey Rasmussen with [facility] called to get instructions on how many times patient should used the testing kit. Due to language barrier, patient does not understand. Please call her at [phone] to advise.

## 2023-06-10 NOTE — Progress Notes (Signed)
Mailed out to patient 

## 2023-09-28 ENCOUNTER — Ambulatory Visit: Payer: Medicare (Managed Care) | Admitting: Family

## 2023-09-28 VITALS — BP 138/56 | HR 73 | Temp 98.6°F | Resp 16 | Ht <= 58 in | Wt 120.0 lb

## 2023-09-28 DIAGNOSIS — Z7984 Long term (current) use of oral hypoglycemic drugs: Secondary | ICD-10-CM

## 2023-09-28 DIAGNOSIS — E785 Hyperlipidemia, unspecified: Secondary | ICD-10-CM | POA: Diagnosis not present

## 2023-09-28 DIAGNOSIS — M810 Age-related osteoporosis without current pathological fracture: Secondary | ICD-10-CM

## 2023-09-28 DIAGNOSIS — I10 Essential (primary) hypertension: Secondary | ICD-10-CM

## 2023-09-28 DIAGNOSIS — E119 Type 2 diabetes mellitus without complications: Secondary | ICD-10-CM

## 2023-09-28 LAB — GLUCOSE, POCT (MANUAL RESULT ENTRY): POC Glucose: 348 mg/dL — AB (ref 70–99)

## 2023-09-28 NOTE — Assessment & Plan Note (Signed)
 Lab Results  Component Value Date   HGBA1C 6.4 06/07/2023   HGBA1C 7.3 (H) 11/02/2022   HGBA1C 7.6 (H) 08/03/2022   Lab Results  Component Value Date   MICROALBUR <0.7 11/02/2022   LDLCALC 82 10/01/2020   CREATININE 0.74 06/07/2023   Last A1C at goal, glucose high today in office. Update A1C and advised pt to increase her metformin to bid.

## 2023-09-28 NOTE — Assessment & Plan Note (Addendum)
 BP Readings from Last 3 Encounters:  09/28/23 (!) 138/56  06/07/23 (!) 111/56  11/02/22 119/63  Maintained on amlodpine.

## 2023-09-28 NOTE — Assessment & Plan Note (Signed)
 Last dexa 6/23. Continue fosamax, plan to repeat dexa after 6/25.

## 2023-09-28 NOTE — Progress Notes (Unsigned)
 Subjective:     Patient ID: Hailey Rasmussen, female    DOB: 02-18-1951, 73 y.o.   MRN: [id number]  Chief Complaint  Patient presents with   Diabetes    Here for follow up    Diabetes    Discussed the use of AI scribe software for clinical note transcription with the patient, who gave verbal consent to proceed.  History of Present Illness   Hailey Rasmussen is a 73 year old female with diabetes who presents for blood sugar monitoring and medication management. She is accompanied by a [place] Interpretor who assists with translation during today's visit.  Her blood sugar was noted to be high today. She does not routinely check her blood sugar at home, but she has a machine and was shown how to use it by the nurse today.  She has been taking metformin once a day, although it was initially prescribed to be taken twice daily. She reduced the dose to once daily after her last blood work showed a blood sugar level of around 130. She acknowledges that she should have been taking it twice a day as prescribed.  She is also on medication for her bones, which she takes once a week.    Health Maintenance Due  Topic Date Due   Medicare Annual Wellness (AWV)  05/12/2016   COVID-19 Vaccine (5 - 2024-25 season) 03/27/2023   OPHTHALMOLOGY EXAM  04/30/2023   DTaP/Tdap/Td (2 - Td or Tdap) 07/27/2023    Past Medical History:  Diagnosis Date   Diabetes mellitus without complication (HCC)    Fatty liver    Hyperlipidemia    Hypertension    Osteoporosis 07/16/2016   Ventral hernia     Past Surgical History:  Procedure Laterality Date   COLONOSCOPY      Family History  Problem Relation Age of Onset   Heart disease Father    Colon cancer Neg Hx    Esophageal cancer Neg Hx    Rectal cancer Neg Hx    Stomach cancer Neg Hx     Social History   Socioeconomic History   Marital status: Married    Spouse name: Not on file   Number of children: Not on file   Years of education:  Not on file   Highest education level: Not on file  Occupational History   Not on file  Tobacco Use   Smoking status: Never   Smokeless tobacco: Never  Vaping Use   Vaping status: Never Used  Substance and Sexual Activity   Alcohol use: No    Alcohol/week: 0.0 standard drinks of alcohol   Drug use: Never   Sexual activity: Not on file  Other Topics Concern   Not on file  Social History Narrative   Lives with her husband and son   She has 7 children (one daughter died at age 19) 6 children all live in [place]   Retired homemaker   She completed high school   Moved to [place] from [place] in 1997      Social Drivers of Corporate investment banker Strain: Not on [employer] Insecurity: Not on file  Transportation Needs: Not on file  Physical Activity: Not on file  Stress: Not on file  Social Connections: Not on file  Intimate Partner Violence: Not on file    Outpatient Medications Prior to Visit  Medication Sig Dispense Refill   Accu-Chek Softclix Lancets lancets Use as instructed 100 each 12   alendronate (  FOSAMAX) 70 MG tablet Take 1 tablet (70 mg total) by mouth once a week. Take with full glass of water on empty stomach 12 tablet 1   amLODipine (NORVASC) 5 MG tablet Take 1 tablet (5 mg total) by mouth daily. 90 tablet 1   atorvastatin (LIPITOR) 10 MG tablet Take 1 tablet (10 mg total) by mouth daily. 90 tablet 1   blood glucose meter kit and supplies KIT Dispense based on patient and insurance preference. Use up to four times daily as directed. ONE TOUCH MONITOR 1 EACH, STRIPS 100 EACH, AND LANCETS 100 EACH. Controlled type 2 diabetes mellitus without complication, without long-term current use of insulin [E11.9] 1 each 0   Calcium Carbonate-Vitamin D (CALTRATE 600+D) 600-400 MG-UNIT tablet Take 1 tablet by mouth 2 (two) times daily.     glucose blood test strip Use as instructed 100 each 12   metFORMIN (GLUCOPHAGE) 500 MG tablet Take 1 tablet (500 mg total) by mouth 2 (two) times  daily with a meal. 180 tablet 1   Zoster Vaccine Adjuvanted [facility]) injection Inject 0.5mg  IM now and repeat in 2-6 months. 0.5 mL 1   No facility-administered medications prior to visit.    No Known Allergies  ROS    See HPI Objective:    Physical Exam Constitutional:      General: She is not in acute distress.    Appearance: Normal appearance. She is well-developed.  HENT:     Head: Normocephalic and atraumatic.     Right Ear: External ear normal.     Left Ear: External ear normal.  Eyes:     General: No scleral icterus. Neck:     Thyroid: No thyromegaly.  Cardiovascular:     Rate and Rhythm: Normal rate and regular rhythm.     Heart sounds: Normal heart sounds. No murmur heard. Pulmonary:     Effort: Pulmonary effort is normal. No respiratory distress.     Breath sounds: Normal breath sounds. No wheezing.  Musculoskeletal:     Cervical back: Neck supple.  Skin:    General: Skin is warm and dry.  Neurological:     Mental Status: She is alert and oriented to person, place, and time.  Psychiatric:        Mood and Affect: Mood normal.        Behavior: Behavior normal.        Thought Content: Thought content normal.        Judgment: Judgment normal.      BP (!) 138/56 (BP Location: Right Arm, Patient Position: Sitting, Cuff Size: Small)   Pulse 73   Temp 98.6 F (37 C) (Oral)   Resp 16   Ht 4\' 10"  (1.473 m)   Wt 120 lb (54.4 kg)   LMP 07/27/1995   SpO2 99%   BMI 25.08 kg/m  Wt Readings from Last 3 Encounters:  09/28/23 120 lb (54.4 kg)  06/07/23 115 lb (52.2 kg)  11/02/22 121 lb (54.9 kg)       Assessment & Plan:   Problem List Items Addressed This Visit       Unprioritized   Osteoporosis   Last dexa 6/23. Continue fosamax, plan to repeat dexa after 6/25.      Hypertension   BP Readings from Last 3 Encounters:  09/28/23 (!) 138/56  06/07/23 (!) 111/56  11/02/22 119/63  Maintained on amlodipine.       Hyperlipidemia   Lab Results   Component Value Date   CHOL  141 08/03/2022   HDL 49.20 08/03/2022   LDLCALC 82 10/01/2020   LDLDIRECT 57.0 08/03/2022   TRIG 329.0 (H) 08/03/2022   CHOLHDL 3 08/03/2022  Last LDL at goal, will update lipid panel today. Continue lipitor.       Relevant Orders   Comp Met (CMET) (Completed)   Lipid panel (Completed)   Controlled type 2 diabetes mellitus without complication, without long-term current use of insulin (HCC) - Primary   Lab Results  Component Value Date   HGBA1C 6.4 06/07/2023   HGBA1C 7.3 (H) 11/02/2022   HGBA1C 7.6 (H) 08/03/2022   Lab Results  Component Value Date   MICROALBUR <0.7 11/02/2022   LDLCALC 82 10/01/2020   CREATININE 0.74 06/07/2023   Last A1C at goal, glucose high today in office. Update A1C and advised pt to increase her metformin to bid.      Relevant Orders   HgB A1c (Completed)   Comp Met (CMET) (Completed)   POCT Glucose (CBG) (Completed)   Urine Microalbumin w/creat. ratio (Completed)    I am having Pola Corn maintain her Calcium Carbonate-Vitamin D, Shingrix, Accu-Chek Softclix Lancets, blood glucose meter kit and supplies, metFORMIN, atorvastatin, amLODipine, alendronate, and glucose blood.  No orders of the defined types were placed in this encounter.

## 2023-09-28 NOTE — Assessment & Plan Note (Signed)
 Lab Results  Component Value Date   CHOL 141 08/03/2022   HDL 49.20 08/03/2022   LDLCALC 82 10/01/2020   LDLDIRECT 57.0 08/03/2022   TRIG 329.0 (H) 08/03/2022   CHOLHDL 3 08/03/2022  Last LDL at goal, will update lipid panel today. Continue lipitor.

## 2023-09-29 ENCOUNTER — Telehealth: Payer: Self-pay | Admitting: Family

## 2023-09-29 LAB — COMPREHENSIVE METABOLIC PANEL
ALT: 16 U/L (ref 0–35)
AST: 20 U/L (ref 0–37)
Albumin: 4.6 g/dL (ref 3.5–5.2)
Alkaline Phosphatase: 73 U/L (ref 39–117)
BUN: 13 mg/dL (ref 6–23)
CO2: 27 meq/L (ref 19–32)
Calcium: 9.6 mg/dL (ref 8.4–10.5)
Chloride: 102 meq/L (ref 96–112)
Creatinine, Ser: 0.72 mg/dL (ref 0.40–1.20)
GFR: 82.2 mL/min (ref >60.00)
Glucose, Bld: 310 mg/dL — ABNORMAL HIGH (ref 70–99)
Potassium: 4.2 meq/L (ref 3.5–5.1)
Sodium: 139 meq/L (ref 135–145)
Total Bilirubin: 0.4 mg/dL (ref 0.2–1.2)
Total Protein: 7.7 g/dL (ref 6.0–8.3)

## 2023-09-29 LAB — MICROALBUMIN / CREATININE URINE RATIO
Creatinine,U: 46.4 mg/dL
Microalb Creat Ratio: 29.5 mg/g (ref 0.0–30.0)
Microalb, Ur: 1.4 mg/dL (ref 0.0–1.9)

## 2023-09-29 LAB — LIPID PANEL
Cholesterol: 141 mg/dL (ref 0–200)
HDL: 56.7 mg/dL (ref >39.00)
LDL Cholesterol: 26 mg/dL (ref 0–99)
NonHDL: 84.56
Total CHOL/HDL Ratio: 2
Triglycerides: 292 mg/dL — ABNORMAL HIGH (ref 0.0–149.0)
VLDL: 58.4 mg/dL — ABNORMAL HIGH (ref 0.0–40.0)

## 2023-09-29 LAB — HEMOGLOBIN A1C: Hgb A1c MFr Bld: 7.3 % — ABNORMAL HIGH (ref 4.6–6.5)

## 2023-09-29 NOTE — Telephone Encounter (Signed)
 Sugar slightly above goal.  Please increase metformin from once a day to twice a day.

## 2023-09-29 NOTE — Patient Instructions (Signed)
 VISIT SUMMARY:  Today, we discussed your blood sugar levels and medication management. We also reviewed your bone health and general health maintenance.  YOUR PLAN:  -TYPE 2 DIABETES MELLITUS: Type 2 Diabetes Mellitus is a condition where your body does not use insulin properly, leading to high blood sugar levels. Your blood sugar was high today, and it was noted that you have been taking Metformin once daily instead of the prescribed twice daily. Please resume taking Metformin twice daily. Additionally, you should check your blood sugar levels at home daily at different times and keep a log. If your blood sugar levels consistently exceed 350, you or a family member should contact the clinic. We will review your blood sugar log at your next visit.  -BONE HEALTH: Bone health is important to prevent conditions like osteoporosis. You are currently on a weekly medication for your bones. Please continue taking this medication as prescribed. We plan to repeat your bone density test in the fall.  -GENERAL HEALTH MAINTENANCE: Routine blood work will be done today to monitor your overall health. We will follow up in three months. If there are any concerns with your lab results, the clinic will contact you.  INSTRUCTIONS:  Please follow up in three months for a review of your blood sugar log and general health maintenance. If your blood sugar levels consistently exceed 350, contact the clinic immediately.  For more information, you can read your full clinical note, available in your patient portal.

## 2023-09-30 NOTE — Telephone Encounter (Signed)
 Information and instructions given to patient's daughter

## 2023-12-30 ENCOUNTER — Ambulatory Visit: Payer: Medicare (Managed Care) | Admitting: Family

## 2024-01-06 ENCOUNTER — Ambulatory Visit: Payer: Medicare (Managed Care) | Admitting: Family

## 2024-01-18 ENCOUNTER — Ambulatory Visit: Payer: Medicare (Managed Care) | Admitting: Family

## 2024-01-18 VITALS — BP 113/54 | HR 66 | Temp 98.9°F | Resp 16 | Ht <= 58 in | Wt 113.0 lb

## 2024-01-18 DIAGNOSIS — I10 Essential (primary) hypertension: Secondary | ICD-10-CM

## 2024-01-18 DIAGNOSIS — E785 Hyperlipidemia, unspecified: Secondary | ICD-10-CM | POA: Diagnosis not present

## 2024-01-18 DIAGNOSIS — M810 Age-related osteoporosis without current pathological fracture: Secondary | ICD-10-CM | POA: Diagnosis not present

## 2024-01-18 DIAGNOSIS — Z7984 Long term (current) use of oral hypoglycemic drugs: Secondary | ICD-10-CM

## 2024-01-18 DIAGNOSIS — E119 Type 2 diabetes mellitus without complications: Secondary | ICD-10-CM

## 2024-01-18 NOTE — Assessment & Plan Note (Signed)
 Lab Results  Component Value Date   CHOL 141 09/28/2023   HDL 56.70 09/28/2023   LDLCALC 26 09/28/2023   LDLDIRECT 57.0 08/03/2022   TRIG 292.0 (H) 09/28/2023   CHOLHDL 2 09/28/2023   LDL at goal. Continue atorvastatin .

## 2024-01-18 NOTE — Assessment & Plan Note (Addendum)
 BP at goal, continue amlodipine  5 mg once daily.

## 2024-01-18 NOTE — Assessment & Plan Note (Signed)
 Due for Dexa, continues fosamax  and calcium .

## 2024-01-18 NOTE — Patient Instructions (Signed)
 VISIT SUMMARY:  Today, we reviewed your management plan for diabetes, hypertension, and cholesterol. We also discussed the need for an eye exam and a bone density screening.  YOUR PLAN:  TYPE 2 DIABETES MELLITUS: Your blood glucose levels have slightly increased. -Continue taking metformin  500 mg twice daily. -Make dietary changes: limit rice and potatoes, and increase vegetables, chicken, fish, [place], eggs, and yogurt. -We will order lab work to update your blood glucose levels.  DIABETIC RETINOPATHY SCREENING: You are overdue for an eye exam to monitor for diabetic retinopathy. -Arrange for an eye exam at [facility].  HYPERTENSION: Your blood pressure is well-controlled with your current medication. -Continue taking amlodipine  5 mg daily.  HYPERLIPIDEMIA: Your cholesterol levels are well-controlled with your current medication. -Continue taking atorvastatin  at the current dose.  OSTEOPOROSIS SCREENING: A bone density screening is needed to assess your bone health due to your age. -We will order a bone density screening test.

## 2024-01-18 NOTE — Assessment & Plan Note (Addendum)
 Lab Results  Component Value Date   HGBA1C 7.3 (H) 09/28/2023   HGBA1C 6.4 06/07/2023   HGBA1C 7.3 (H) 11/02/2022   Lab Results  Component Value Date   MICROALBUR 1.4 09/28/2023   LDLCALC 26 09/28/2023   CREATININE 0.72 09/28/2023   Last A1C was slightly above goal. Recheck A1C today and continue metformin .

## 2024-01-18 NOTE — Progress Notes (Signed)
 Subjective:     Patient ID: Hailey Rasmussen, female    DOB: 1951/01/03, 73 y.o.   MRN: [id number]  Chief Complaint  Patient presents with   Diabetes    Here for follow up   Hypertension    Here for follow up    HPI  Discussed the use of AI scribe software for clinical note transcription with the patient, who gave verbal consent to proceed.  History of Present Illness  Hailey Rasmussen is a 73 year old female who presents for routine follow-up. She is accompanied by her husband. Hypertension is managed with amlodipine  5 mg daily, maintaining stable and well-controlled blood pressure. Diabetes is managed with metformin  500 mg twice daily, though there is a slight increase in blood glucose levels noted recently.  Cholesterol levels remain well-controlled with atorvastatin , continuing on the same dose.      Health Maintenance Due  Topic Date Due   Medicare Annual Wellness (AWV)  05/12/2016   Hepatitis B Vaccines (2 of 3 - 19+ 3-dose series) 08/31/2022   COVID-19 Vaccine (5 - 2024-25 season) 03/27/2023   OPHTHALMOLOGY EXAM  04/30/2023   DTaP/Tdap/Td (2 - Td or Tdap) 07/27/2023    Past Medical History:  Diagnosis Date   Diabetes mellitus without complication (HCC)    Fatty liver    Hyperlipidemia    Hypertension    Osteoporosis 07/16/2016   Ventral hernia     Past Surgical History:  Procedure Laterality Date   COLONOSCOPY      Family History  Problem Relation Age of Onset   Heart disease Father    Colon cancer Neg Hx    Esophageal cancer Neg Hx    Rectal cancer Neg Hx    Stomach cancer Neg Hx     Social History   Socioeconomic History   Marital status: Married    Spouse name: Not on file   Number of children: Not on file   Years of education: Not on file   Highest education level: Not on file  Occupational History   Not on file  Tobacco Use   Smoking status: Never   Smokeless tobacco: Never  Vaping Use   Vaping status: Never Used  Substance and Sexual  Activity   Alcohol use: No    Alcohol/week: 0.0 standard drinks of alcohol   Drug use: Never   Sexual activity: Not on file  Other Topics Concern   Not on file  Social History Narrative   Lives with her husband and son   She has 7 children (one daughter died at age 87) 6 children all live in [place]   Retired homemaker   She completed high school   Moved to [place]  from [place] in 1997      Social Drivers of Corporate investment banker Strain: Not on [employer] Insecurity: Not on file  Transportation Needs: Not on file  Physical Activity: Not on file  Stress: Not on file  Social Connections: Not on file  Intimate Partner Violence: Not on file    Outpatient Medications Prior to Visit  Medication Sig Dispense Refill   Accu-Chek Softclix Lancets lancets Use as instructed 100 each 12   alendronate  (FOSAMAX ) 70 MG tablet Take 1 tablet (70 mg total) by mouth once a week. Take with full glass of water on empty stomach 12 tablet 1   amLODipine  (NORVASC ) 5 MG tablet Take 1 tablet (5 mg total) by mouth daily. 90 tablet 1   atorvastatin  (  LIPITOR) 10 MG tablet Take 1 tablet (10 mg total) by mouth daily. 90 tablet 1   blood glucose meter kit and supplies KIT Dispense based on patient and insurance preference. Use up to four times daily as directed. ONE TOUCH MONITOR 1 EACH, STRIPS 100 EACH, AND LANCETS 100 EACH. Controlled type 2 diabetes mellitus without complication, without long-term current use of insulin [E11.9] 1 each 0   Calcium  Carbonate-Vitamin D  (CALTRATE 600+D) 600-400 MG-UNIT tablet Take 1 tablet by mouth 2 (two) times daily.     glucose blood test strip Use as instructed 100 each 12   metFORMIN  (GLUCOPHAGE ) 500 MG tablet Take 1 tablet (500 mg total) by mouth 2 (two) times daily with a meal. 180 tablet 1   Zoster Vaccine Adjuvanted (SHINGRIX ) injection Inject 0.5mg  IM now and repeat in 2-6 months. 0.5 mL 1   No facility-administered medications prior to visit.    No Known  Allergies  ROS See HPI     Objective:    Physical Exam Constitutional:      General: She is not in acute distress.    Appearance: Normal appearance. She is well-developed.  HENT:     Head: Normocephalic and atraumatic.     Right Ear: External ear normal.     Left Ear: External ear normal.   Eyes:     General: No scleral icterus.  Neck:     Thyroid : No thyromegaly.   Cardiovascular:     Rate and Rhythm: Normal rate and regular rhythm.     Heart sounds: Normal heart sounds. No murmur heard. Pulmonary:     Effort: Pulmonary effort is normal. No respiratory distress.     Breath sounds: Normal breath sounds. No wheezing.   Musculoskeletal:     Cervical back: Neck supple.   Skin:    General: Skin is warm and dry.   Neurological:     Mental Status: She is alert and oriented to person, place, and time.   Psychiatric:        Mood and Affect: Mood normal.        Behavior: Behavior normal.        Thought Content: Thought content normal.        Judgment: Judgment normal.    Diabetic Foot Exam - Simple   Simple Foot Form Diabetic Foot exam was performed with the following findings: Yes 01/18/2024  2:07 PM  Visual Inspection No deformities, no ulcerations, no other skin breakdown bilaterally: Yes Sensation Testing Intact to touch and monofilament testing bilaterally: Yes Pulse Check Posterior Tibialis and Dorsalis pulse intact bilaterally: Yes Comments       BP (!) 113/54 (BP Location: Right Arm, Patient Position: Sitting, Cuff Size: Small)   Pulse 66   Temp 98.9 F (37.2 C) (Oral)   Resp 16   Ht 4' 10 (1.473 m)   Wt 113 lb (51.3 kg)   LMP 07/27/1995   SpO2 98%   BMI 23.62 kg/m  Wt Readings from Last 3 Encounters:  01/18/24 113 lb (51.3 kg)  09/28/23 120 lb (54.4 kg)  06/07/23 115 lb (52.2 kg)       Assessment & Plan:   Problem List Items Addressed This Visit       Unprioritized   Osteoporosis   Due for Dexa, continues fosamax  and calcium .        Relevant Orders   DG Bone Density   Hypertension - Primary   BP at goal, continue amlodipine  5 mg once daily.  Hyperlipidemia   Lab Results  Component Value Date   CHOL 141 09/28/2023   HDL 56.70 09/28/2023   LDLCALC 26 09/28/2023   LDLDIRECT 57.0 08/03/2022   TRIG 292.0 (H) 09/28/2023   CHOLHDL 2 09/28/2023   LDL at goal. Continue atorvastatin .       Controlled type 2 diabetes mellitus without complication, without long-term current use of insulin (HCC)   Lab Results  Component Value Date   HGBA1C 7.3 (H) 09/28/2023   HGBA1C 6.4 06/07/2023   HGBA1C 7.3 (H) 11/02/2022   Lab Results  Component Value Date   MICROALBUR 1.4 09/28/2023   LDLCALC 26 09/28/2023   CREATININE 0.72 09/28/2023   Last A1C was slightly above goal. Recheck A1C today and continue metformin .      Relevant Orders   Ambulatory referral to Ophthalmology    I am having Hailey Rasmussen maintain her Calcium  Carbonate-Vitamin D , Shingrix , Accu-Chek Softclix Lancets, blood glucose meter kit and supplies, metFORMIN , atorvastatin , amLODipine , alendronate , and glucose blood.  No orders of the defined types were placed in this encounter.

## 2024-02-09 ENCOUNTER — Other Ambulatory Visit: Payer: Self-pay | Admitting: Family

## 2024-02-09 DIAGNOSIS — E119 Type 2 diabetes mellitus without complications: Secondary | ICD-10-CM

## 2024-02-14 ENCOUNTER — Other Ambulatory Visit (HOSPITAL_BASED_OUTPATIENT_CLINIC_OR_DEPARTMENT_OTHER): Payer: Medicare (Managed Care)

## 2024-02-14 ENCOUNTER — Other Ambulatory Visit: Payer: Self-pay | Admitting: Family

## 2024-02-27 ENCOUNTER — Ambulatory Visit (HOSPITAL_BASED_OUTPATIENT_CLINIC_OR_DEPARTMENT_OTHER)
Admission: RE | Admit: 2024-02-27 | Discharge: 2024-02-27 | Disposition: A | Discharge status: Home / Self Care | Payer: Medicare (Managed Care) | Source: Ambulatory Visit | Attending: Family | Admitting: Family

## 2024-02-27 DIAGNOSIS — M810 Age-related osteoporosis without current pathological fracture: Secondary | ICD-10-CM | POA: Diagnosis present

## 2024-02-28 ENCOUNTER — Ambulatory Visit: Payer: Self-pay | Admitting: Family

## 2024-03-20 ENCOUNTER — Other Ambulatory Visit: Payer: Self-pay | Admitting: Family

## 2024-06-25 ENCOUNTER — Other Ambulatory Visit: Payer: Self-pay | Admitting: Family

## 2024-07-12 ENCOUNTER — Other Ambulatory Visit: Payer: Self-pay | Admitting: Family

## 2024-07-12 MED ORDER — METFORMIN HCL 500 MG PO TABS: 500.0000 mg | ORAL_TABLET | Freq: Two times a day (BID) | ORAL | 0 refills | Status: DC

## 2024-07-12 MED ORDER — AMLODIPINE BESYLATE 5 MG PO TABS: 5.0000 mg | ORAL_TABLET | Freq: Every day | ORAL | 0 refills | Status: DC

## 2024-07-12 MED ORDER — ATORVASTATIN CALCIUM 10 MG PO TABS: 10.0000 mg | ORAL_TABLET | Freq: Every day | ORAL | 0 refills | Status: DC

## 2024-07-12 NOTE — Telephone Encounter (Signed)
 Copied from CRM #[id number]. Topic: Clinical - Medication Refill >> Jul 12, 2024  9:27 AM Zy'onna H wrote: Medication:  1. amLODipine  (NORVASC ) 5 MG tablet 2. atorvastatin  (LIPITOR) 10 MG tablet 3. Calcium  Carbonate-Vitamin D  (CALTRATE 600+D) 600-400 MG-UNIT tablet 4. metFORMIN  (GLUCOPHAGE ) 500 MG tablet  **Patient daughter called in requesting her mother's following Rx's are refilled for 90 day supply; as the patient will be traveling at the end of this month to go to her home country for 3 months.**   Has the patient contacted their pharmacy? No (Agent: If no, request that the patient contact the pharmacy for the refill. If patient does not wish to contact the pharmacy document the reason why and proceed with request.) (Agent: If yes, when and what did the pharmacy advise?)  This is the patient's preferred pharmacy:  [facility] [address], [place] - [address] [place] [address] Phone: [phone] Fax: [phone]  Is this the correct pharmacy for this prescription? Yes If no, delete pharmacy and type the correct one.   Has the prescription been filled recently? Yes  Is the patient out of the medication? No  Has the patient been seen for an appointment in the last year OR does the patient have an upcoming appointment? Yes  Can we respond through MyChart? No  Agent: Please be advised that Rx refills may take up to 3 business days. We ask that you follow-up with your pharmacy.

## 2024-07-12 NOTE — Telephone Encounter (Signed)
 Unable to pend Calcium /Vit D

## 2024-07-17 ENCOUNTER — Other Ambulatory Visit: Payer: Self-pay | Admitting: Family

## 2024-07-17 DIAGNOSIS — E119 Type 2 diabetes mellitus without complications: Secondary | ICD-10-CM

## 2024-07-17 NOTE — Telephone Encounter (Signed)
 Overdue for appt

## 2024-07-17 NOTE — Telephone Encounter (Signed)
 Copied from CRM #[id number]. Topic: Clinical - Medication Refill >> Jul 17, 2024 10:27 AM Alexandria E wrote: Medication: glucose blood (ONETOUCH ULTRA) test strip *Patient is needing a 3 month supply*  Has the patient contacted their pharmacy? No (Agent: If no, request that the patient contact the pharmacy for the refill. If patient does not wish to contact the pharmacy document the reason why and proceed with request.) (Agent: If yes, when and what did the pharmacy advise?)  This is the patient's preferred pharmacy:  [id number] - [place], [place] - [address] [place] [address] Phone: [phone] Fax: [phone]    Is this the correct pharmacy for this prescription? Yes If no, delete pharmacy and type the correct one.   Has the prescription been filled recently? No  Is the patient out of the medication? Yes  Has the patient been seen for an appointment in the last year OR does the patient have an upcoming appointment? Yes  Can we respond through MyChart? No  Agent: Please be advised that Rx refills may take up to 3 business days. We ask that you follow-up with your pharmacy.

## 2024-07-18 MED ORDER — ONETOUCH ULTRA VI STRP: ORAL_STRIP | 12 refills | Status: AC

## 2024-07-18 NOTE — Telephone Encounter (Signed)
 Spoke w/ Pt's daughter, Marilyne, informed Pt is due for appt. Pt is leaving out of the country for 3 months on 07/24/24 and will return at the end of March 2026. F/u appt scheduled for when she returns. Rx sent.

## 2024-07-20 ENCOUNTER — Other Ambulatory Visit: Payer: Self-pay

## 2024-07-20 ENCOUNTER — Telehealth: Payer: Self-pay

## 2024-07-20 MED ORDER — METFORMIN HCL 500 MG PO TABS: 500.0000 mg | ORAL_TABLET | Freq: Two times a day (BID) | ORAL | 0 refills | Status: DC

## 2024-07-20 MED ORDER — ATORVASTATIN CALCIUM 10 MG PO TABS: 10.0000 mg | ORAL_TABLET | Freq: Every day | ORAL | 0 refills | Status: DC

## 2024-07-20 MED ORDER — AMLODIPINE BESYLATE 5 MG PO TABS: 5.0000 mg | ORAL_TABLET | Freq: Every day | ORAL | 0 refills | Status: DC

## 2024-07-20 NOTE — Telephone Encounter (Signed)
 Tried to contact pt with no answer 90 day fils sent to pharmacy     Copied from CRM [id number]. Topic: Clinical - Prescription Issue >> Jul 20, 2024 11:33 AM Mia F wrote: Reason for CRM: Pt daughter Nivia called in because she requested a refill for pt on the  amLODipine  (NORVASC ) 5 MG tablet, metFORMIN  (GLUCOPHAGE ) 500 MG tablet, atorvastatin  (LIPITOR) 10 MG tablet, and Calcium  Carbonate-Vitamin D  (CALTRATE 600+D) 600-400 MG-UNIT tablet. She says when she called it in she asked for a 3 month supply because pt would be going out of the country but they only received a one month supply. She says pt is leaving in two days and asks if this could be sent asap

## 2024-07-20 NOTE — Telephone Encounter (Signed)
 Spoke to pts daughter and advised them that I sent in a 90 day supply. I asked her did they pick up the 30 day and she said yes. I then told her that insurance will not cover since they already picked it up. She was not understanding due to her request for a 90 days ago and only a 30 was sent in. She said it is not her fault. I notified her that I will contact the pharmacy and see what else can be done and I will call her back after 2 but did advise her that she might have to pay out of pocket

## 2024-07-23 ENCOUNTER — Telehealth: Payer: Self-pay

## 2024-07-23 MED ORDER — ATORVASTATIN CALCIUM 10 MG PO TABS: 10.0000 mg | ORAL_TABLET | Freq: Every day | ORAL | 0 refills | Status: DC

## 2024-07-23 MED ORDER — AMLODIPINE BESYLATE 5 MG PO TABS: 5.0000 mg | ORAL_TABLET | Freq: Every day | ORAL | 0 refills | Status: AC

## 2024-07-23 MED ORDER — METFORMIN HCL 500 MG PO TABS: 500.0000 mg | ORAL_TABLET | Freq: Two times a day (BID) | ORAL | 0 refills | Status: AC

## 2024-07-23 NOTE — Telephone Encounter (Signed)
 Rx's sent. Calcium  and vit D supplement available otc.

## 2024-07-23 NOTE — Telephone Encounter (Signed)
 Copied from CRM #[id number]. Topic: Clinical - Prescription Issue >> Jul 20, 2024 11:33 AM Mia F wrote: Reason for CRM: Pt daughter Nivia called in because she requested a refill for pt on the  amLODipine  (NORVASC ) 5 MG tablet, metFORMIN  (GLUCOPHAGE ) 500 MG tablet, atorvastatin  (LIPITOR) 10 MG tablet, and Calcium  Carbonate-Vitamin D  (CALTRATE 600+D) 600-400 MG-UNIT tablet. She says when she called it in she asked for a 3 month supply because pt would be going out of the country but they only received a one month supply. She says pt is leaving in two days and asks if this could be sent asap >> Jul 23, 2024 11:21 AM Amy B wrote: Patient is leaving the country today and still has not received a 65-month supply of refills.

## 2024-08-29 ENCOUNTER — Other Ambulatory Visit: Payer: Self-pay | Admitting: Family

## 2024-10-24 ENCOUNTER — Ambulatory Visit: Payer: Medicare (Managed Care) | Admitting: Family
